# Patient Record
Sex: Male | Born: 1945 | Race: White | Hispanic: No | Marital: Married | Smoking: Current every day smoker
Health system: Southern US, Community
[De-identification: ages and names within clinical notes are randomized; demographics above are authoritative.]

## PROBLEM LIST (undated history)

## (undated) DIAGNOSIS — I82409 Acute embolism and thrombosis of unspecified deep veins of unspecified lower extremity: Secondary | ICD-10-CM

## (undated) DIAGNOSIS — M1990 Unspecified osteoarthritis, unspecified site: Secondary | ICD-10-CM

## (undated) DIAGNOSIS — I10 Essential (primary) hypertension: Secondary | ICD-10-CM

## (undated) DIAGNOSIS — I429 Cardiomyopathy, unspecified: Secondary | ICD-10-CM

## (undated) DIAGNOSIS — E785 Hyperlipidemia, unspecified: Secondary | ICD-10-CM

## (undated) DIAGNOSIS — J449 Chronic obstructive pulmonary disease, unspecified: Secondary | ICD-10-CM

## (undated) DIAGNOSIS — I2510 Atherosclerotic heart disease of native coronary artery without angina pectoris: Secondary | ICD-10-CM

## (undated) DIAGNOSIS — K219 Gastro-esophageal reflux disease without esophagitis: Secondary | ICD-10-CM

## (undated) DIAGNOSIS — I6529 Occlusion and stenosis of unspecified carotid artery: Secondary | ICD-10-CM

## (undated) DIAGNOSIS — I701 Atherosclerosis of renal artery: Secondary | ICD-10-CM

## (undated) DIAGNOSIS — R911 Solitary pulmonary nodule: Secondary | ICD-10-CM

## (undated) DIAGNOSIS — Z720 Tobacco use: Secondary | ICD-10-CM

## (undated) DIAGNOSIS — I639 Cerebral infarction, unspecified: Secondary | ICD-10-CM

## (undated) DIAGNOSIS — I739 Peripheral vascular disease, unspecified: Secondary | ICD-10-CM

## (undated) HISTORY — DX: Cardiomyopathy, unspecified: I42.9

## (undated) HISTORY — DX: Unspecified osteoarthritis, unspecified site: M19.90

## (undated) HISTORY — DX: Atherosclerosis of renal artery: I70.1

## (undated) HISTORY — DX: Hyperlipidemia, unspecified: E78.5

## (undated) HISTORY — DX: Atherosclerotic heart disease of native coronary artery without angina pectoris: I25.10

## (undated) HISTORY — DX: Chronic obstructive pulmonary disease, unspecified: J44.9

## (undated) HISTORY — DX: Tobacco use: Z72.0

## (undated) HISTORY — DX: Cerebral infarction, unspecified: I63.9

## (undated) HISTORY — DX: Essential (primary) hypertension: I10

## (undated) HISTORY — DX: Acute embolism and thrombosis of unspecified deep veins of unspecified lower extremity: I82.409

## (undated) HISTORY — DX: Occlusion and stenosis of unspecified carotid artery: I65.29

## (undated) HISTORY — DX: Peripheral vascular disease, unspecified: I73.9

## (undated) HISTORY — DX: Solitary pulmonary nodule: R91.1

---

## 2012-05-29 DIAGNOSIS — I639 Cerebral infarction, unspecified: Secondary | ICD-10-CM

## 2012-05-29 HISTORY — DX: Cerebral infarction, unspecified: I63.9

## 2012-06-05 ENCOUNTER — Ambulatory Visit (INDEPENDENT_AMBULATORY_CARE_PROVIDER_SITE_OTHER): Payer: Managed Care, Other (non HMO) | Admitting: Vascular Surgery

## 2012-06-05 ENCOUNTER — Encounter: Payer: Self-pay | Admitting: Surgery

## 2012-06-05 ENCOUNTER — Other Ambulatory Visit: Payer: Self-pay

## 2012-06-05 ENCOUNTER — Ambulatory Visit (INDEPENDENT_AMBULATORY_CARE_PROVIDER_SITE_OTHER): Payer: Managed Care, Other (non HMO) | Admitting: Surgery

## 2012-06-05 VITALS — BP 165/85 | HR 80 | Resp 16 | Ht 66.0 in | Wt 156.0 lb

## 2012-06-05 DIAGNOSIS — I6529 Occlusion and stenosis of unspecified carotid artery: Secondary | ICD-10-CM

## 2012-06-05 DIAGNOSIS — I63239 Cerebral infarction due to unspecified occlusion or stenosis of unspecified carotid arteries: Secondary | ICD-10-CM

## 2012-06-05 DIAGNOSIS — Z01810 Encounter for preprocedural cardiovascular examination: Secondary | ICD-10-CM

## 2012-06-05 NOTE — Addendum Note (Signed)
Addended by: Sharee Pimple on: 06/05/2012 12:50 PM   Modules accepted: Orders

## 2012-06-05 NOTE — Progress Notes (Signed)
[facility] [place]   Patient name: Belal Murad MRN: [id number] DOB: 07/06/1946 Sex: male   Referred by: Dr. Gage  Reason for referral:  Chief Complaint  Patient presents with  . Carotid    New pt    HISTORY OF PRESENT ILLNESS: The patient is an adult for today's clinic for evaluation of carotid stenosis in the setting of its recent stroke. This is a 66-year-old gentleman who was no medical problems prior to November 18. At that time he developed right hand numbness as well as trouble straightening his fingers. He was admitted to the hospital with an acute stroke. His initial CT scan of the head was negative however a MRI revealed acute left middle cerebral artery infarcts involving the left frontal and parietal lobe. There was a tiny left parietal acute infarct. He also underwent a carotid duplex ultrasound which showed greater than 70% left carotid stenosis and 70% right carotid stenosis. The patient is back to his normal neurologic status. He is now taking 3 antihypertensive medications. He is also on a statin for elevated cholesterol. He is taking Plavix as well.  Past Medical History  Diagnosis Date  . Carotid artery occlusion   . Right arm weakness   . Stroke 05/29/2012    No past surgical history on file.  History   Social History  . Marital Status: Married    Spouse Name: N/A    Number of Children: N/A  . Years of Education: N/A   Occupational History  . Not on file.   Social History Main Topics  . Smoking status: Current Every Day Smoker -- 1.0 packs/day for 45 years    Types: Cigarettes  . Smokeless tobacco: Never Used  . Alcohol Use: 27.0 oz/week    45 Cans of beer per week  . Drug Use: No  . Sexually Active: Not on file   Other Topics Concern  . Not on file   Social History Narrative  . No narrative on file    No family history on file.  Allergies as of 06/05/2012 - Review Complete 06/05/2012  Allergen Reaction Noted  .  Penicillins  06/05/2012    Current Outpatient Prescriptions on File Prior to Visit  Medication Sig Dispense Refill  . atorvastatin (LIPITOR) 10 MG tablet Take 10 mg by mouth daily.      . metoprolol succinate (TOPROL-XL) 25 MG 24 hr tablet Take 25 mg by mouth daily.      . valsartan-hydrochlorothiazide (DIOVAN-HCT) 320-12.5 MG per tablet Take 1 tablet by mouth daily.         REVIEW OF SYSTEMS: Please see history of present illness. All of systems are negative  PHYSICAL EXAMINATION: General: The patient appears their stated age.  Vital signs are BP 165/85  Pulse 80  Resp 16  Ht 5' 6" (1.676 m)  Wt 156 lb (70.761 kg)  BMI 25.18 kg/m2  SpO2 100% HEENT:  No gross abnormalities Pulmonary: Respirations are non-labored Abdomen: Soft and non-tender . Aorta feels non-aneurysmal Musculoskeletal: There are no major deformities.   Neurologic: No focal weakness or paresthesias are detected, Skin: There are no ulcer or rashes noted. Psychiatric: The patient has normal affect. Cardiovascular: There is a regular rate and rhythm without significant murmur appreciated. No carotid bruits  Diagnostic Studies: Ultrasound was repeated today this shows 40-59% bilateral carotid stenosis  Outside Studies/Documentation Historical records were reviewed.  They showed left brain stroke, carotid stenosis  Medication Changes: I placed the patient back on   an 81 mg aspirin in addition to his Plavix  Assessment:  Left brain stroke Plan: Ultrasound in my office today revealed stenosis of the left carotid artery that is not as significant as the stenosis obtained at the hospital. For definitive recommendations can be made I need to better define the degree of stenosis within the left carotid artery. For that reason I scheduled the patient for a CT angiogram to be performed in the immediate future. Should the patient be found to have a moderate to high-grade left carotid stenosis, we would proceed with carotid  endarterectomy. If the stenosis is found to be less than 50%, I would pursue medical therapy only. I did discuss the details of the operation including the risks and benefits. We discussed the risk of stroke as well as the  Risk of nerve injury. We also discussed the risk of bleeding. I do not want him to stop his aspirin or Plavix prior to any operation. I have tentatively scheduled him for a left carotid endarterectomy on Friday, December 6. I will cancel his operation if the CT scan does not show significant left carotid stenosis.      V. Wells Novalynn Branaman IV, M.D. [facility] [place] Office: [phone] Pager:  [phone]   

## 2012-06-05 NOTE — Progress Notes (Signed)
Carotid duplex performed @ [facility] 06/05/2012 

## 2012-06-06 ENCOUNTER — Encounter (HOSPITAL_COMMUNITY)
Admission: RE | Admit: 2012-06-06 | Discharge: 2012-06-06 | Disposition: A | Discharge status: Home / Self Care | Payer: Managed Care, Other (non HMO) | Source: Ambulatory Visit | Attending: Surgery | Admitting: Surgery

## 2012-06-06 ENCOUNTER — Other Ambulatory Visit: Payer: Self-pay

## 2012-06-06 ENCOUNTER — Encounter (HOSPITAL_COMMUNITY): Payer: Self-pay

## 2012-06-06 LAB — COMPREHENSIVE METABOLIC PANEL
ALT: 23 U/L (ref 0–53)
AST: 18 U/L (ref 0–37)
Alkaline Phosphatase: 60 U/L (ref 39–117)
CO2: 25 mEq/L (ref 19–32)
GFR calc Af Amer: >90 mL/min (ref >90)
GFR calc non Af Amer: >90 mL/min (ref >90)
Glucose, Bld: 100 mg/dL — ABNORMAL HIGH (ref 70–99)
Potassium: 4.6 mEq/L (ref 3.5–5.1)
Sodium: 124 mEq/L — ABNORMAL LOW (ref 135–145)

## 2012-06-06 LAB — URINALYSIS, ROUTINE W REFLEX MICROSCOPIC
Bilirubin Urine: NEGATIVE
Glucose, UA: NEGATIVE mg/dL
Ketones, ur: 15 mg/dL — AB
Leukocytes, UA: NEGATIVE
Specific Gravity, Urine: 1.018 (ref 1.005–1.030)
pH: 6.5 (ref 5.0–8.0)

## 2012-06-06 LAB — CBC
Hemoglobin: 14.2 g/dL (ref 13.0–17.0)
RBC: 4.58 MIL/uL (ref 4.22–5.81)
WBC: 15.6 10*3/uL — ABNORMAL HIGH (ref 4.0–10.5)

## 2012-06-06 LAB — ABO/RH: ABO/RH(D): O POS

## 2012-06-06 LAB — PROTIME-INR: Prothrombin Time: 11.6 seconds (ref 11.6–15.2)

## 2012-06-06 LAB — APTT: aPTT: 32 seconds (ref 24–37)

## 2012-06-06 LAB — SURGICAL PCR SCREEN: Staphylococcus aureus: NEGATIVE

## 2012-06-06 NOTE — Pre-Procedure Instructions (Addendum)
[address] Rom[address] Letizia  06/06/2012   Your procedure is scheduled on:  Friday, December 6th  Report to [facility] at 0830 AM.   Call this number if you have problems the morning of surgery: [phone]   Remember:   Do not eat food or drink any liquids:After Midnight Thursday   Take these medicines the morning of surgery with A SIP OF WATER: toprol   Do not wear jewelry.  Do not wear lotions, powders, deoderant or colognes.                                                                                                    Men may shave face and neck.   Do not bring valuables to the hospital.  Contacts, dentures or bridgework may not be worn into surgery.   Leave suitcase in the car. After surgery it may be brought to your room.  For patients admitted to the hospital, checkout time is 11:00 AM the day of discharge.   Patients discharged the day of surgery will not be allowed to drive home.   Special Instructions: Shower using CHG 2 nights before surgery and the night before surgery.  If you shower the day of surgery use CHG.  Use special wash - you have one bottle of CHG for all showers.  You should use approximately 1/3 of the bottle for each shower.   Please read over the following fact sheets that you were given: Pain Booklet, Coughing and Deep Breathing, Blood Transfusion Information, MRSA Information and Surgical Site Infection Prevention

## 2012-06-06 NOTE — Progress Notes (Addendum)
Tuesday @ [facility].  AFTER SPEAKING WITH WIFE, PATIENT HAD BLOOD PRESSURE ISSUES A YR AGO, BUT DID NOT DO ANYTHING ABOUT IT THEN.  LAST WEEK,  WIFE HAD TAKEN HIM TO DR. GAGE IN [place], BECAUSE HE HAD 'A BUG'....AND MD NOTED BP WAS STILL UP....SO, SEEING DR. Samuel Germany WAS HIS FIRST 'REAL' VISIT IN A LONG TIME....DA       I CALLED [facility] TO SEE IF [facility] HAD EKG FOR COMPARISON....THEY DO NOT HAVE ONE!  DA

## 2012-06-06 NOTE — Progress Notes (Signed)
Discussed abnormal sodium level with Dr. Noreene Larsson.  Request lab be repeated day of surgery and have Revonda Standard review chart on 06/07/12.

## 2012-06-07 ENCOUNTER — Encounter (HOSPITAL_COMMUNITY): Payer: Self-pay | Admitting: Vascular Surgery

## 2012-06-07 NOTE — Consult Note (Addendum)
Anesthesia chart review: Patient is a 66 year old male scheduled for left carotid endarterectomy on 06/16/2012 by Dr. Myra Gianotti. History includes left parietal CVA 05/29/2012, HTN, smoking. Previous BP readings on 06/05/12 were 208/88 with recheck of 165/85 and on 06/06/12 192/90.  Meds include Plavix, Lipitor, Toprol XL 25 mg daily, Diovan-HCT 320/12.5 daily. PCP is Dr. Renae Fickle.   EKG on 06/06/12 showed NSR, possible LAE, LVH with proabable repolarization abnormality (versus lateral ischemia), cannot rule out septal infarct (age undetermined). His T wave abnormality is less apparent on his EKG from [facility] from 05/29/12.  There are no other comparison EKGs at his PCP office.      Echo on 06/02/12 [facility]) showed mild concentric LVH, normal global LV contractility, low-normal LV systolic function, EF 50-55%.  There is evidence of paradoxical septal motion.  Diastolic filling pattern indicates impaired relaxation.  No regional wall motion abnormalities.  Normal RV size and function.  Normal LA/RA size.  No AS or AR.  No MR.  Mild TR.  No evidence of pulmonary HTN.  RVSP 35.49 mmHg.  No pulmonic regurgitation.  Normal appearing aortic root, ascending aorta, and aortic arch. No pericardial effusion.  CXR on 06/06/12 showed: Possible nodule or mass seen projected over lower thoracic spine on the lateral projection only. CT scan of chest is recommended for further evaluation.  (I've called this result to Lamar Blinks, RN at [facility]).  Labs noted.  Na 124, Cl 90, glucose 100, WBC 15.6. (I called Na and WBC results to Okey Regal at [facility].) He will need a repeat BMET on arrival.  I reviewed above with Anesthesiologist Dr. Noreene Larsson.  If no acute CV symptoms and Na is stable/improved then he anticipates patient can proceed as planned.  Defer timing of possible T-spine nodule to Dr. Myra Gianotti.  Dr. Martha Clan office is now closed.  I will ask nursing staff to contact Dr. Martha Clan office on the next business day  to ensure he (or one of his partners) is aware of the lab results so further recommendations can be made as felt appropriate.   Shonna Chock, PA-C 06/07/12 1730  Addendum: 06/12/12 1115 I spoke with Luster Landsberg earlier today at Dr. Martha Clan office regarding patient's hyponatremia.  Labs faxed to his office for further review.  Dr. Myra Gianotti has arranged for patient to have a nuclear stress test on 06/14/12 at [facility] and plans to order a chest CT while patient is in the hospital for his carotid surgery.  Addendum: 06/15/12 0940 Patient had a stress and echo on 06/14/12.  Nuclear stress test results showed: There is no sign of scar or ischemia. There is decreased LV systolic function with global hypokinesis. LV Ejection Fraction: 35%. LV Wall Motion: Global hypokinesis.  Patient subsequently had an echo that showed:  Left ventricle: The cavity size was normal. Wall thickness was increased in a pattern of mild LVH. Systolic function was mildly to moderately reduced. The estimated ejection fraction was in the range of 40% to 45%. There is hypokinesis of the apical myocardium. Doppler parameters are consistent with abnormal left ventricular relaxation (grade 1 diastolic dysfunction).  Patient now has a Cardiology appointment with Dr. Shirlee Latch at 1630 today.  Will follow-up results if available by the end of the day.  (Currently, patient is not scheduled to be a first case.)

## 2012-06-12 ENCOUNTER — Other Ambulatory Visit: Payer: Self-pay | Admitting: *Deleted

## 2012-06-12 DIAGNOSIS — Z01810 Encounter for preprocedural cardiovascular examination: Secondary | ICD-10-CM

## 2012-06-14 ENCOUNTER — Other Ambulatory Visit: Payer: Self-pay

## 2012-06-14 ENCOUNTER — Ambulatory Visit (HOSPITAL_COMMUNITY): Payer: Managed Care, Other (non HMO) | Attending: Surgery | Admitting: Radiology

## 2012-06-14 ENCOUNTER — Telehealth: Payer: Self-pay

## 2012-06-14 ENCOUNTER — Ambulatory Visit (HOSPITAL_BASED_OUTPATIENT_CLINIC_OR_DEPARTMENT_OTHER): Payer: Managed Care, Other (non HMO) | Admitting: Radiology

## 2012-06-14 VITALS — BP 182/82 | Ht 66.0 in | Wt 155.0 lb

## 2012-06-14 DIAGNOSIS — R9439 Abnormal result of other cardiovascular function study: Secondary | ICD-10-CM

## 2012-06-14 DIAGNOSIS — Z01810 Encounter for preprocedural cardiovascular examination: Secondary | ICD-10-CM

## 2012-06-14 DIAGNOSIS — R5381 Other malaise: Secondary | ICD-10-CM | POA: Insufficient documentation

## 2012-06-14 DIAGNOSIS — Z8673 Personal history of transient ischemic attack (TIA), and cerebral infarction without residual deficits: Secondary | ICD-10-CM | POA: Insufficient documentation

## 2012-06-14 DIAGNOSIS — R0602 Shortness of breath: Secondary | ICD-10-CM

## 2012-06-14 DIAGNOSIS — R5383 Other fatigue: Secondary | ICD-10-CM | POA: Insufficient documentation

## 2012-06-14 DIAGNOSIS — R9430 Abnormal result of cardiovascular function study, unspecified: Secondary | ICD-10-CM

## 2012-06-14 DIAGNOSIS — I6529 Occlusion and stenosis of unspecified carotid artery: Secondary | ICD-10-CM

## 2012-06-14 DIAGNOSIS — R0609 Other forms of dyspnea: Secondary | ICD-10-CM | POA: Insufficient documentation

## 2012-06-14 DIAGNOSIS — R0989 Other specified symptoms and signs involving the circulatory and respiratory systems: Secondary | ICD-10-CM | POA: Insufficient documentation

## 2012-06-14 DIAGNOSIS — I10 Essential (primary) hypertension: Secondary | ICD-10-CM | POA: Insufficient documentation

## 2012-06-14 DIAGNOSIS — I779 Disorder of arteries and arterioles, unspecified: Secondary | ICD-10-CM | POA: Insufficient documentation

## 2012-06-14 DIAGNOSIS — F17200 Nicotine dependence, unspecified, uncomplicated: Secondary | ICD-10-CM | POA: Insufficient documentation

## 2012-06-14 MED ORDER — REGADENOSON 0.4 MG/5ML IV SOLN
0.4000 mg | Freq: Once | INTRAVENOUS | Status: AC
  Administered 2012-06-14: 0.4 mg via INTRAVENOUS

## 2012-06-14 MED ORDER — TECHNETIUM TC 99M SESTAMIBI GENERIC - CARDIOLITE
33.0000 | Freq: Once | INTRAVENOUS | Status: AC | PRN
  Administered 2012-06-14: 33 via INTRAVENOUS

## 2012-06-14 MED ORDER — TECHNETIUM TC 99M SESTAMIBI GENERIC - CARDIOLITE
10.9000 | Freq: Once | INTRAVENOUS | Status: AC | PRN
  Administered 2012-06-14: 10.9 via INTRAVENOUS

## 2012-06-14 NOTE — Patient Instructions (Signed)
Per Dr Johney Frame  Echo/Dr Shirlee Latch ----pt having surgery on Friday with Dr Myra Gianotti

## 2012-06-14 NOTE — Progress Notes (Signed)
Echocardiogram performed.  

## 2012-06-14 NOTE — Telephone Encounter (Signed)
Release faxed to [facility] @ [phone] to Obtain Echo. **Echo was Received back from [facility] gave to [facility] 06/14/12/KM

## 2012-06-14 NOTE — Progress Notes (Signed)
[facility] SITE 3 NUCLEAR MED [address] [id number] [place] [address] [phone]  Cardiology Nuclear Med Study  Darrell Willis is a 66 y.o. male     MRN : [id number]     DOB: [birth year]  Procedure Date: 06/14/2012  Nuclear Med Background Indication for Stress Test:  Evaluation for Ischemia, and  Surgical Clearance for  (L) CEA on 06-16-12  by Dr. Juleen China History:  No prior Cardiac History Cardiac Risk Factors: Carotid Disease, CVA, Hypertension and Smoker  Symptoms:  DOE and Fatigue   Nuclear Pre-Procedure Caffeine/Decaff Intake:  Patient admitted to drinking a few sips of coffee @ 6:30 AM after Lexiscan injection. > 12 hrs NPO After: 8:00pm   Lungs:  clear O2 Sat: 97% on room air. IV 0.9% NS with Angio Cath:  20g  IV Site: R Antecubital x 1, tolerated well IV Started by:  Irean Hong, RN  Chest Size (in):  40 Cup Size: n/a  Height: 5\' 6"  (1.676 m)  Weight:  155 lb (70.308 kg)  BMI:  Body mass index is 25.02 kg/(m^2). Tech Comments: Patient with low EF-Dr. Allred aware. Patient scheduled for Echo this afternoon and OV with Dr. Shirlee Latch tomorrow.    Nuclear Med Study 1 or 2 day study: 1 day  Stress Test Type:  Eugenie Birks  Reading MD: Cassell Clement, MD  Order Authorizing Provider:  Arelia Longest, MD  Resting Radionuclide: Technetium 6m Sestamibi  Resting Radionuclide Dose: 10.9 mCi   Stress Radionuclide:  Technetium 39m Sestamibi  Stress Radionuclide Dose: 33.0 mCi           Stress Protocol Rest HR: 66 Stress HR: 82  Rest BP: 182/82 Stress BP: 193/95  Exercise Time (min): n/a METS: n/a   Predicted Max HR: 156 bpm % Max HR: 52.56 bpm Rate Pressure Product: 62952   Dose of Adenosine (mg):  n/a Dose of Lexiscan: 0.4 mg  Dose of Atropine (mg): n/a Dose of Dobutamine: n/a mcg/kg/min (at max HR)  Stress Test Technologist: Bonnita Levan, RN  Nuclear Technologist:  Domenic Polite, CNMT     Rest Procedure:  Myocardial perfusion imaging  was performed at rest 45 minutes following the intravenous administration of Technetium 16m Tetrofosmin. Rest ECG: NSR with non-specific ST-T wave changes  Stress Procedure:  The patient received IV Lexiscan 0.4 mg over 15-seconds.  Technetium 41m Tetrofosmin injected at 30-seconds.   Quantitative spect images were obtained after a 45 minute delay. Stress ECG: No significant change from baseline ECG  QPS Raw Data Images:  Normal; no motion artifact; normal heart/lung ratio. Stress Images:  Normal homogeneous uptake in all areas of the myocardium. Rest Images:  Normal homogeneous uptake in all areas of the myocardium. Subtraction (SDS):  No evidence of ischemia. Transient Ischemic Dilatation (Normal <1.22):  1.14 Lung/Heart Ratio (Normal <0.45):  0.34  Quantitative Gated Spect Images QGS EDV:  161 ml QGS ESV:  104 ml  Impression Exercise Capacity:  Lexiscan with no exercise. BP Response:  Normal blood pressure response. Clinical Symptoms:  There is dyspnea. ECG Impression:  No significant ST segment change suggestive of ischemia. Comparison with Prior Nuclear Study: No previous nuclear study performed  Overall Impression:  There is no sign of scar or ischemia. There is decreased LV systolic function with global hypokinesis.  LV Ejection Fraction: 35%.  LV Wall Motion:  Global hypokinesis  Cassell Clement

## 2012-06-15 ENCOUNTER — Encounter: Payer: Self-pay | Admitting: Cardiology

## 2012-06-15 ENCOUNTER — Ambulatory Visit (INDEPENDENT_AMBULATORY_CARE_PROVIDER_SITE_OTHER): Payer: Managed Care, Other (non HMO) | Admitting: Cardiology

## 2012-06-15 ENCOUNTER — Encounter: Payer: Self-pay | Admitting: *Deleted

## 2012-06-15 ENCOUNTER — Encounter (HOSPITAL_COMMUNITY): Payer: Self-pay | Admitting: Surgery

## 2012-06-15 ENCOUNTER — Other Ambulatory Visit: Payer: Self-pay | Admitting: Cardiology

## 2012-06-15 ENCOUNTER — Ambulatory Visit: Payer: Managed Care, Other (non HMO) | Admitting: Cardiology

## 2012-06-15 VITALS — BP 160/74 | HR 98 | Ht 66.0 in | Wt 156.0 lb

## 2012-06-15 DIAGNOSIS — I429 Cardiomyopathy, unspecified: Secondary | ICD-10-CM | POA: Insufficient documentation

## 2012-06-15 DIAGNOSIS — R9430 Abnormal result of cardiovascular function study, unspecified: Secondary | ICD-10-CM

## 2012-06-15 DIAGNOSIS — I639 Cerebral infarction, unspecified: Secondary | ICD-10-CM | POA: Insufficient documentation

## 2012-06-15 DIAGNOSIS — F17200 Nicotine dependence, unspecified, uncomplicated: Secondary | ICD-10-CM | POA: Insufficient documentation

## 2012-06-15 DIAGNOSIS — I6529 Occlusion and stenosis of unspecified carotid artery: Secondary | ICD-10-CM

## 2012-06-15 DIAGNOSIS — I10 Essential (primary) hypertension: Secondary | ICD-10-CM | POA: Insufficient documentation

## 2012-06-15 DIAGNOSIS — I6350 Cerebral infarction due to unspecified occlusion or stenosis of unspecified cerebral artery: Secondary | ICD-10-CM

## 2012-06-15 DIAGNOSIS — I428 Other cardiomyopathies: Secondary | ICD-10-CM

## 2012-06-15 DIAGNOSIS — I5022 Chronic systolic (congestive) heart failure: Secondary | ICD-10-CM

## 2012-06-15 MED ORDER — VANCOMYCIN HCL IN DEXTROSE 1-5 GM/200ML-% IV SOLN: 1000.0000 mg | INTRAVENOUS | Status: DC

## 2012-06-15 MED ORDER — AMLODIPINE BESYLATE 5 MG PO TABS: 5.0000 mg | ORAL_TABLET | Freq: Every day | ORAL | Status: DC

## 2012-06-15 MED ORDER — ATORVASTATIN CALCIUM 40 MG PO TABS: 40.0000 mg | ORAL_TABLET | Freq: Every day | ORAL | Status: DC

## 2012-06-15 MED ORDER — SODIUM CHLORIDE 0.9 % IV SOLN: INTRAVENOUS | Status: DC

## 2012-06-15 NOTE — Progress Notes (Signed)
Patient ID: Darrell Willis, male   DOB: 11/21/1945, 66 y.o.   MRN: [id number] PCP: Dr. Samuel Germany  66 yo with history of recent CVA presents for cardiology evaluation prior to left CEA.  Patient was admitted to [facility] in 11/13 with RUE weakness.  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  CEA was planned for tomorrow.  He was sent for preoperative Lexiscan myoview showing EF 35% but no evidence for ischemia or infarction.  Echo was then done, showing EF 40% with apical hypokinesis. He is an active smoker.  BP is uncontrolled (SBP up to 170s at home).    Darrell Willis denies chest pain.  He has mild DOE walking up hills, otherwise he tends to do ok with exertion.  He still works full time as a Designer, industrial/product.  Main exertional complaint is left calf pain.  He notes this with walking after about 100-200 feet.  If he rests briefly the pain resolved.    ECG: NSR, LVH with repolarization abnormality  Labs (11/13): Na 124, K 4.6, creatinine 0.81   PMH: 1. HTN 2. Hyperlipidemia 3. CVA: 11/13 admission to [facility] with right arm weakness and was found to have acute CVA.  L MCA infarct was found.  Carotid dopplers showed > 70% bilateral ICA stenosis.  CTA neck showed severe left ICA stenosis.   4. Active smoker, 1 ppd.  5. Cardiomyopathy: Lexiscan myoview (12/13) with EF 35%, global hypokinesis, no scar or ischemia. Echo (12/13) with EF 40-45%, mild LVH, apical hypokinesis.    SH: Married, lives in [place], smokes 1 ppd, Designer, industrial/product.   FH: No known premature CAD.   ROS: All systems reviewed and negative except as per HPI.   Current Outpatient Prescriptions  Medication Sig Dispense Refill  . amLODipine (NORVASC) 5 MG tablet Take 1 tablet (5 mg total) by mouth daily.  30 tablet  6  . aspirin EC 81 MG tablet Take 81 mg by mouth daily.      [name] atorvastatin (LIPITOR) 40 MG tablet Take 1 tablet (40 mg total) by mouth daily.  30 tablet  3  . clopidogrel  (PLAVIX) 75 MG tablet Take 75 mg by mouth daily.      . metoprolol succinate (TOPROL-XL) 25 MG 24 hr tablet Take 25 mg by mouth daily.      . valsartan-hydrochlorothiazide (DIOVAN-HCT) 320-12.5 MG per tablet Take 1 tablet by mouth daily.      . [DISCONTINUED] atorvastatin (LIPITOR) 10 MG tablet Take 10 mg by mouth daily.       No current facility-administered medications for this visit.   Facility-Administered Medications Ordered in Other Visits  Medication Dose Route Frequency Provider Last Rate Last Dose  . 0.9 %  sodium chloride infusion   Intravenous Continuous Nada Libman, MD      . vancomycin (VANCOCIN) IVPB 1000 mg/200 mL premix  1,000 mg Intravenous 60 min Pre-Op Nada Libman, MD       BP 160/74  Pulse 98  Ht 5\' 6"  (1.676 m)  Wt 156 lb (70.761 kg)  BMI 25.18 kg/m2  SpO2 98% General: NAD Neck: No JVD, no thyromegaly or thyroid nodule.  Lungs: Clear to auscultation with prolonged expiratory phase.  CV: Nondisplaced PMI.  Heart regular S1/S2, no S3/S4, no murmur.  No peripheral edema.  Bilateral carotid bruits.  Absent left pedal pulses.  Abdomen: Soft, nontender, no hepatosplenomegaly, no distention.  Skin: Intact without lesions or rashes.  Neurologic: Alert and oriented x 3.  Psych: Normal affect. Extremities: No clubbing or cyanosis.  HEENT: Normal.   Assessment/Plan: 1. Cardiomyopathy: EF 35% by Myoview, 40% by echo. The Myoview did not show ischemia or infarction by perfusion imaging.  The echo showed apical hypokinesis.  Darrell Willis does not have chest pain episodes.  He gets short of breath walking up a hill.  He has known vascular disease and is a smoker with poorly controlled HTN and hyperlipidemia.  I am concerned that he has an ischemic cardiomyopathy.  I think he is at substantial risk for three vessel disease which could explain the lack of significant defect seen on Myoview (possible balanced ischemia).  The apical wall motion abnormality on the echo is  concerning for an LAD lesion.  Given the intermediate risk myoview with concern for multivessel CAD, I will plan on taking Darrell Willis for [facility] tomorrow.  I talked to Dr. Myra Gianotti today and will delay surgery until after cath.  He should continue ASA 81, Toprol XL, Plavix, ARB.  Would eventually transition off Toprol XL and onto Coreg given low EF.   2. CVA: Recent CVA with severe LICA stenosis, needs left CEA soon.  As above, concern for possible multivessel CAD so will do cath tomorrow pre-surgery.   - Continue ASA, Plavix.  - Would increase statin to higher dose, use atorvastatin 40 mg daily instead of 10 mg daily.  Needs lipids/LFTs in 2 months.  3. Smoking: I strongly encouraged patient to quit smoking.  4. HTN: Uncontrolled.  I will add amlodipine 5 mg daily to his regimen.   5. Claudication: Symptoms concerning for left calf claudication with difficult to palpate pulses in the left foot.  He will need peripheral arterial dopplers evaluation.   Marca Ancona 06/15/2012

## 2012-06-15 NOTE — Progress Notes (Signed)
Patient notified of time change to be here at 0730 tomorrow morning.

## 2012-06-15 NOTE — Patient Instructions (Addendum)
Increase atorvastatin to 40mg  daily. You can take 4 of your 10mg  tablets at the same time and use your current supply.  Start amlodipine 5mg  daily.  Your physician has requested that you have a cardiac catheterization. Cardiac catheterization is used to diagnose and/or treat various heart conditions. Doctors may recommend this procedure for a number of different reasons. The most common reason is to evaluate chest pain. Chest pain can be a symptom of coronary artery disease (CAD), and cardiac catheterization can show whether plaque is narrowing or blocking your heart's arteries. This procedure is also used to evaluate the valves, as well as measure the blood flow and oxygen levels in different parts of your heart. For further information please visit [email or web address] Please follow instruction sheet, as given.  Your physician recommends that you schedule a follow-up appointment in: 2 weeks with the PA/NP.

## 2012-06-16 ENCOUNTER — Encounter (HOSPITAL_COMMUNITY)
Admission: RE | Disposition: A | Discharge status: Home / Self Care | Payer: Self-pay | Source: Ambulatory Visit | Attending: Surgery

## 2012-06-16 ENCOUNTER — Ambulatory Visit (HOSPITAL_COMMUNITY)
Admission: RE | Admit: 2012-06-16 | Discharge: 2012-06-16 | Disposition: A | Discharge status: Home / Self Care | Payer: Managed Care, Other (non HMO) | Source: Ambulatory Visit | Attending: Surgery | Admitting: Surgery

## 2012-06-16 DIAGNOSIS — I2510 Atherosclerotic heart disease of native coronary artery without angina pectoris: Secondary | ICD-10-CM

## 2012-06-16 DIAGNOSIS — I428 Other cardiomyopathies: Secondary | ICD-10-CM | POA: Insufficient documentation

## 2012-06-16 DIAGNOSIS — E785 Hyperlipidemia, unspecified: Secondary | ICD-10-CM | POA: Insufficient documentation

## 2012-06-16 DIAGNOSIS — I63239 Cerebral infarction due to unspecified occlusion or stenosis of unspecified carotid arteries: Secondary | ICD-10-CM | POA: Insufficient documentation

## 2012-06-16 DIAGNOSIS — I6320 Cerebral infarction due to unspecified occlusion or stenosis of unspecified precerebral arteries: Secondary | ICD-10-CM | POA: Insufficient documentation

## 2012-06-16 DIAGNOSIS — I10 Essential (primary) hypertension: Secondary | ICD-10-CM | POA: Insufficient documentation

## 2012-06-16 DIAGNOSIS — I429 Cardiomyopathy, unspecified: Secondary | ICD-10-CM

## 2012-06-16 HISTORY — PX: LEFT HEART CATHETERIZATION WITH CORONARY ANGIOGRAM: SHX5451

## 2012-06-16 SURGERY — LEFT HEART CATHETERIZATION WITH CORONARY ANGIOGRAM
Anesthesia: LOCAL

## 2012-06-16 SURGERY — ENDARTERECTOMY, CAROTID
Anesthesia: General | Site: Neck | Laterality: Left

## 2012-06-16 MED ORDER — LIDOCAINE HCL (PF) 1 % IJ SOLN
INTRAMUSCULAR | Status: AC
  Filled 2012-06-16: qty 30

## 2012-06-16 MED ORDER — ONDANSETRON HCL 4 MG/2ML IJ SOLN: 4.0000 mg | Freq: Four times a day (QID) | INTRAMUSCULAR | Status: DC | PRN

## 2012-06-16 MED ORDER — SODIUM CHLORIDE 0.9 % IV SOLN
INTRAVENOUS | Status: DC
  Administered 2012-06-16: 09:00:00 via INTRAVENOUS

## 2012-06-16 MED ORDER — SODIUM CHLORIDE 0.9 % IV SOLN: INTRAVENOUS | Status: DC

## 2012-06-16 MED ORDER — ASPIRIN 81 MG PO CHEW
324.0000 mg | CHEWABLE_TABLET | Freq: Once | ORAL | Status: AC
  Administered 2012-06-16: 324 mg via ORAL
  Filled 2012-06-16: qty 4

## 2012-06-16 MED ORDER — FENTANYL CITRATE 0.05 MG/ML IJ SOLN
INTRAMUSCULAR | Status: AC
  Filled 2012-06-16: qty 2

## 2012-06-16 MED ORDER — NITROGLYCERIN 0.2 MG/ML ON CALL CATH LAB
INTRAVENOUS | Status: AC
  Filled 2012-06-16: qty 1

## 2012-06-16 MED ORDER — HEPARIN SODIUM (PORCINE) 1000 UNIT/ML IJ SOLN
INTRAMUSCULAR | Status: AC
  Filled 2012-06-16: qty 1

## 2012-06-16 MED ORDER — SODIUM CHLORIDE 0.9 % IJ SOLN: 3.0000 mL | INTRAMUSCULAR | Status: DC | PRN

## 2012-06-16 MED ORDER — SODIUM CHLORIDE 0.9 % IJ SOLN: 3.0000 mL | Freq: Two times a day (BID) | INTRAMUSCULAR | Status: DC

## 2012-06-16 MED ORDER — SODIUM CHLORIDE 0.9 % IV SOLN: 250.0000 mL | INTRAVENOUS | Status: DC | PRN

## 2012-06-16 MED ORDER — ACETAMINOPHEN 325 MG PO TABS: 650.0000 mg | ORAL_TABLET | ORAL | Status: DC | PRN

## 2012-06-16 MED ORDER — ASPIRIN 81 MG PO CHEW
CHEWABLE_TABLET | ORAL | Status: AC
  Administered 2012-06-16: 324 mg via ORAL
  Filled 2012-06-16: qty 4

## 2012-06-16 MED ORDER — HEPARIN (PORCINE) IN NACL 2-0.9 UNIT/ML-% IJ SOLN
INTRAMUSCULAR | Status: AC
  Filled 2012-06-16: qty 1000

## 2012-06-16 MED ORDER — VERAPAMIL HCL 2.5 MG/ML IV SOLN
INTRAVENOUS | Status: AC
  Filled 2012-06-16: qty 2

## 2012-06-16 MED ORDER — MIDAZOLAM HCL 2 MG/2ML IJ SOLN
INTRAMUSCULAR | Status: AC
  Filled 2012-06-16: qty 2

## 2012-06-16 NOTE — CV Procedure (Signed)
   Cardiac Catheterization Procedure Note  Name: Darrell Willis MRN: [id number] DOB: 09-Jan-1946  Procedure: Left Heart Cath, Selective Coronary Angiography, LV angiography  Indication: Abnormal Myoview and echo, cardiomyopathy.   Procedural Details: The right wrist was prepped, draped, and anesthetized with 1% lidocaine. Using the modified Seldinger technique, a 5 French sheath was introduced into the right radial artery. 3 mg of verapamil was administered through the sheath, weight-based unfractionated heparin was administered intravenously. Standard Judkins catheters were used for selective coronary angiography and left ventriculography. Catheter exchanges were performed over an exchange length guidewire. There were no immediate procedural complications. A TR band was used for radial hemostasis at the completion of the procedure.  The patient was transferred to the post catheterization recovery area for further monitoring.  Procedural Findings: Hemodynamics: AO 146/62 LV 150/14  Coronary angiography: Coronary dominance: right  Left mainstem: 20% distal left main stenosis.   Left anterior descending (LAD): 30% proximal LAD stenosis.  The proximal to mid LAD was heavily calcified and remodeled.  There was 70% stenosis in the mid LAD after the take-off of a moderate D1.    Left circumflex (LCx): 40% ostial LCx stenosis.  Small OM1 with mild disease.  Moderate OM2 with 50-60% proximal stenosis.  Large OM3 with 40% proximal stenosis.   Right coronary artery (RCA): 90% proximal RCA stenosis.    Left ventriculography: LV EF 50% with basal to mid inferior hypokinesis.  No significant MR.    Final Conclusions:  Heavily calcified LAD with moderate mid-vessel stenosis.  90% proximal RCA stenosis.  I again questioned the patient and he has no chest pain and dyspnea only with heavy exertion.  EF does not appear as low on LV-gram as on echo and myoview.  Given lack of symptoms and lack of critical  LAD stenosis, I think that the best course here will be medical management prior to surgery.  We could intervene on the proximal RCA if symptoms develop.  I reviewed the films with Dr. [place].   Recommendations: Aggressive medical management.  I will stop Toprol XL and add Coreg 6.25 mg bid to be titrated up to 12.5 mg bid after 3 days if he tolerates the initial dosing with no problems.  Needs better blood pressure control.  Continue statin, have increased atorvastatin to 40 mg daily.  I spoke with Dr. Myra Gianotti, and plan will be to proceed with CEA next week.   Marca Ancona 06/16/2012, 11:07 AM

## 2012-06-16 NOTE — H&P (View-Only) (Signed)
Patient ID: Darrell Willis, male   DOB: 09/16/1945, 66 y.o.   MRN: [id number] PCP: Dr. Gage  66 yo with history of recent CVA presents for cardiology evaluation prior to left CEA.  Patient was admitted to [facility] in 11/13 with RUE weakness.  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  CEA was planned for tomorrow.  He was sent for preoperative Lexiscan myoview showing EF 35% but no evidence for ischemia or infarction.  Echo was then done, showing EF 40% with apical hypokinesis. He is an active smoker.  BP is uncontrolled (SBP up to 170s at home).    Darrell Willis denies chest pain.  He has mild DOE walking up hills, otherwise he tends to do ok with exertion.  He still works full time as a plastic mold technician.  Main exertional complaint is left calf pain.  He notes this with walking after about 100-200 feet.  If he rests briefly the pain resolved.    ECG: NSR, LVH with repolarization abnormality  Labs (11/13): Na 124, K 4.6, creatinine 0.81   PMH: 1. HTN 2. Hyperlipidemia 3. CVA: 11/13 admission to [facility] with right arm weakness and was found to have acute CVA.  L MCA infarct was found.  Carotid dopplers showed > 70% bilateral ICA stenosis.  CTA neck showed severe left ICA stenosis.   4. Active smoker, 1 ppd.  5. Cardiomyopathy: Lexiscan myoview (12/13) with EF 35%, global hypokinesis, no scar or ischemia. Echo (12/13) with EF 40-45%, mild LVH, apical hypokinesis.    SH: Married, lives in [place], smokes 1 ppd, plastic mold technician.   FH: No known premature CAD.   ROS: All systems reviewed and negative except as per HPI.   Current Outpatient Prescriptions  Medication Sig Dispense Refill  . amLODipine (NORVASC) 5 MG tablet Take 1 tablet (5 mg total) by mouth daily.  30 tablet  6  . aspirin EC 81 MG tablet Take 81 mg by mouth daily.      . atorvastatin (LIPITOR) 40 MG tablet Take 1 tablet (40 mg total) by mouth daily.  30 tablet  3  . clopidogrel  (PLAVIX) 75 MG tablet Take 75 mg by mouth daily.      . metoprolol succinate (TOPROL-XL) 25 MG 24 hr tablet Take 25 mg by mouth daily.      . valsartan-hydrochlorothiazide (DIOVAN-HCT) 320-12.5 MG per tablet Take 1 tablet by mouth daily.      . [DISCONTINUED] atorvastatin (LIPITOR) 10 MG tablet Take 10 mg by mouth daily.       No current facility-administered medications for this visit.   Facility-Administered Medications Ordered in Other Visits  Medication Dose Route Frequency Provider Last Rate Last Dose  . 0.9 %  sodium chloride infusion   Intravenous Continuous Vance W Brabham, MD      . vancomycin (VANCOCIN) IVPB 1000 mg/200 mL premix  1,000 mg Intravenous 60 min Pre-Op Vance W Brabham, MD       BP 160/74  Pulse 98  Ht 5' 6" (1.676 m)  Wt 156 lb (70.761 kg)  BMI 25.18 kg/m2  SpO2 98% General: NAD Neck: No JVD, no thyromegaly or thyroid nodule.  Lungs: Clear to auscultation with prolonged expiratory phase.  CV: Nondisplaced PMI.  Heart regular S1/S2, no S3/S4, no murmur.  No peripheral edema.  Bilateral carotid bruits.  Absent left pedal pulses.  Abdomen: Soft, nontender, no hepatosplenomegaly, no distention.  Skin: Intact without lesions or rashes.    Neurologic: Alert and oriented x 3.  Psych: Normal affect. Extremities: No clubbing or cyanosis.  HEENT: Normal.   Assessment/Plan: 1. Cardiomyopathy: EF 35% by Myoview, 40% by echo. The Myoview did not show ischemia or infarction by perfusion imaging.  The echo showed apical hypokinesis.  Darrell Willis does not have chest pain episodes.  He gets short of breath walking up a hill.  He has known vascular disease and is a smoker with poorly controlled HTN and hyperlipidemia.  I am concerned that he has an ischemic cardiomyopathy.  I think he is at substantial risk for three vessel disease which could explain the lack of significant defect seen on Myoview (possible balanced ischemia).  The apical wall motion abnormality on the echo is  concerning for an LAD lesion.  Given the intermediate risk myoview with concern for multivessel CAD, I will plan on taking Darrell Willis for LHC tomorrow.  I talked to Dr. Brabham today and will delay surgery until after cath.  He should continue ASA 81, Toprol XL, Plavix, ARB.  Would eventually transition off Toprol XL and onto Coreg given low EF.   2. CVA: Recent CVA with severe LICA stenosis, needs left CEA soon.  As above, concern for possible multivessel CAD so will do cath tomorrow pre-surgery.   - Continue ASA, Plavix.  - Would increase statin to higher dose, use atorvastatin 40 mg daily instead of 10 mg daily.  Needs lipids/LFTs in 2 months.  3. Smoking: I strongly encouraged patient to quit smoking.  4. HTN: Uncontrolled.  I will add amlodipine 5 mg daily to his regimen.   5. Claudication: Symptoms concerning for left calf claudication with difficult to palpate pulses in the left foot.  He will need peripheral arterial dopplers evaluation.   Darrell Willis 06/15/2012   

## 2012-06-16 NOTE — Interval H&P Note (Signed)
History and Physical Interval Note:  06/16/2012 10:20 AM  Darrell Willis  has presented today for surgery, with the diagnosis of cardiomyopathy  The various methods of treatment have been discussed with the patient and family. After consideration of risks, benefits and other options for treatment, the patient has consented to  Procedure(s) (LRB) with comments: LEFT HEART CATHETERIZATION WITH CORONARY ANGIOGRAM (N/A) - radial as a surgical intervention .  The patient's history has been reviewed, patient examined, no change in status, stable for surgery.  I have reviewed the patient's chart and labs.  Questions were answered to the patient's satisfaction.     Dalton [employer]

## 2012-06-20 ENCOUNTER — Other Ambulatory Visit: Payer: Self-pay

## 2012-06-21 ENCOUNTER — Encounter (HOSPITAL_COMMUNITY): Payer: Self-pay

## 2012-06-21 ENCOUNTER — Encounter (HOSPITAL_COMMUNITY)
Admission: RE | Admit: 2012-06-21 | Discharge: 2012-06-21 | Disposition: A | Discharge status: Home / Self Care | Payer: Managed Care, Other (non HMO) | Source: Ambulatory Visit | Attending: Surgery | Admitting: Surgery

## 2012-06-21 LAB — COMPREHENSIVE METABOLIC PANEL
ALT: 28 U/L (ref 0–53)
AST: 17 U/L (ref 0–37)
Alkaline Phosphatase: 66 U/L (ref 39–117)
CO2: 26 mEq/L (ref 19–32)
Calcium: 9 mg/dL (ref 8.4–10.5)
Chloride: 95 mEq/L — ABNORMAL LOW (ref 96–112)
GFR calc non Af Amer: >90 mL/min (ref >90)
Glucose, Bld: 107 mg/dL — ABNORMAL HIGH (ref 70–99)
Potassium: 4.4 mEq/L (ref 3.5–5.1)
Sodium: 130 mEq/L — ABNORMAL LOW (ref 135–145)
Total Bilirubin: 0.3 mg/dL (ref 0.3–1.2)

## 2012-06-21 LAB — TYPE AND SCREEN
ABO/RH(D): O POS
Antibody Screen: NEGATIVE

## 2012-06-21 LAB — PROTIME-INR
INR: 0.91 (ref 0.00–1.49)
Prothrombin Time: 12.2 seconds (ref 11.6–15.2)

## 2012-06-21 LAB — CBC
Hemoglobin: 13.8 g/dL (ref 13.0–17.0)
Platelets: 279 10*3/uL (ref 150–400)
RBC: 4.37 MIL/uL (ref 4.22–5.81)
WBC: 10.5 10*3/uL (ref 4.0–10.5)

## 2012-06-21 LAB — URINALYSIS, ROUTINE W REFLEX MICROSCOPIC
Glucose, UA: NEGATIVE mg/dL
Ketones, ur: NEGATIVE mg/dL
Leukocytes, UA: NEGATIVE
Protein, ur: NEGATIVE mg/dL
Urobilinogen, UA: 0.2 mg/dL (ref 0.0–1.0)

## 2012-06-21 LAB — APTT: aPTT: 32 seconds (ref 24–37)

## 2012-06-21 MED ORDER — VANCOMYCIN HCL IN DEXTROSE 1-5 GM/200ML-% IV SOLN
1000.0000 mg | INTRAVENOUS | Status: AC
  Administered 2012-06-22: 1000 mg via INTRAVENOUS
  Filled 2012-06-21: qty 200

## 2012-06-21 NOTE — Pre-Procedure Instructions (Signed)
[address]  06/21/2012   Your procedure is scheduled on: Thursday, December 12th.  Report to [facility] at 7:30AM.  Call this number if you have problems the morning of surgery: [phone]   Remember: Nothing to eat or drink after Midnight.     Take these medicines the morning of surgery with A SIP OF WATER: Amlodipine (Norvasc), Metoprolol (Toprol XL).    Do not wear jewelry, make-up or nail polish.  Do not wear lotions, powders, or perfumes. You may wear deodorant.  Do not shave 48 hours prior to surgery. Men may shave face and neck.  Do not bring valuables to the hospital.  Contacts, dentures or bridgework may not be worn into surgery.  Leave suitcase in the car. After surgery it may be brought to your room.  For patients admitted to the hospital, checkout time is 11:00 AM the day of discharge.   Patients discharged the day of surgery will not be allowed to drive home.  Name and phone number of your driver: NA    Special Instructions: Shower with CHG wash (Bactoshield) tonight and again in the am prior to arriving to hospital.   Please read over the following fact sheets that you were given: Pain Booklet, Coughing and Deep Breathing, Blood Transfusion Information and Surgical Site Infection Prevention

## 2012-06-22 ENCOUNTER — Ambulatory Visit (HOSPITAL_COMMUNITY): Payer: Managed Care, Other (non HMO) | Admitting: Anesthesiology

## 2012-06-22 ENCOUNTER — Encounter (HOSPITAL_COMMUNITY): Payer: Self-pay | Admitting: Anesthesiology

## 2012-06-22 ENCOUNTER — Encounter (HOSPITAL_COMMUNITY): Payer: Self-pay | Admitting: *Deleted

## 2012-06-22 ENCOUNTER — Inpatient Hospital Stay (HOSPITAL_COMMUNITY)
Admission: RE | Admit: 2012-06-22 | Discharge: 2012-06-23 | DRG: 039 | Disposition: A | Discharge status: Home / Self Care | Payer: Managed Care, Other (non HMO) | Source: Ambulatory Visit | Attending: Surgery | Admitting: Surgery

## 2012-06-22 ENCOUNTER — Encounter (HOSPITAL_COMMUNITY)
Admission: RE | Disposition: A | Discharge status: Home / Self Care | Payer: Self-pay | Source: Ambulatory Visit | Attending: Surgery

## 2012-06-22 ENCOUNTER — Inpatient Hospital Stay (HOSPITAL_COMMUNITY): Payer: Managed Care, Other (non HMO)

## 2012-06-22 DIAGNOSIS — Z8673 Personal history of transient ischemic attack (TIA), and cerebral infarction without residual deficits: Secondary | ICD-10-CM

## 2012-06-22 DIAGNOSIS — J449 Chronic obstructive pulmonary disease, unspecified: Secondary | ICD-10-CM | POA: Diagnosis present

## 2012-06-22 DIAGNOSIS — J4489 Other specified chronic obstructive pulmonary disease: Secondary | ICD-10-CM | POA: Diagnosis present

## 2012-06-22 DIAGNOSIS — Z79899 Other long term (current) drug therapy: Secondary | ICD-10-CM

## 2012-06-22 DIAGNOSIS — F17200 Nicotine dependence, unspecified, uncomplicated: Secondary | ICD-10-CM | POA: Diagnosis present

## 2012-06-22 DIAGNOSIS — I10 Essential (primary) hypertension: Secondary | ICD-10-CM | POA: Diagnosis present

## 2012-06-22 DIAGNOSIS — I6529 Occlusion and stenosis of unspecified carotid artery: Secondary | ICD-10-CM

## 2012-06-22 DIAGNOSIS — Z7982 Long term (current) use of aspirin: Secondary | ICD-10-CM

## 2012-06-22 DIAGNOSIS — E7800 Pure hypercholesterolemia, unspecified: Secondary | ICD-10-CM | POA: Diagnosis present

## 2012-06-22 HISTORY — PX: PATCH ANGIOPLASTY: SHX6230

## 2012-06-22 HISTORY — PX: ENDARTERECTOMY: SHX5162

## 2012-06-22 HISTORY — PX: CAROTID ENDARTERECTOMY: SUR193

## 2012-06-22 SURGERY — ENDARTERECTOMY, CAROTID
Anesthesia: General | Site: Neck | Laterality: Left | Wound class: Clean

## 2012-06-22 MED ORDER — SENNOSIDES-DOCUSATE SODIUM 8.6-50 MG PO TABS
1.0000 | ORAL_TABLET | Freq: Every evening | ORAL | Status: DC | PRN
  Filled 2012-06-22: qty 1

## 2012-06-22 MED ORDER — EPHEDRINE SULFATE 50 MG/ML IJ SOLN
INTRAMUSCULAR | Status: DC | PRN
  Administered 2012-06-22 (×2): 5 mg via INTRAVENOUS
  Administered 2012-06-22: 10 mg via INTRAVENOUS

## 2012-06-22 MED ORDER — 0.9 % SODIUM CHLORIDE (POUR BTL) OPTIME
TOPICAL | Status: DC | PRN
  Administered 2012-06-22: 1000 mL

## 2012-06-22 MED ORDER — ROCURONIUM BROMIDE 100 MG/10ML IV SOLN
INTRAVENOUS | Status: DC | PRN
  Administered 2012-06-22: 40 mg via INTRAVENOUS

## 2012-06-22 MED ORDER — ONDANSETRON HCL 4 MG/2ML IJ SOLN: 4.0000 mg | Freq: Once | INTRAMUSCULAR | Status: DC | PRN

## 2012-06-22 MED ORDER — PROTAMINE SULFATE 10 MG/ML IV SOLN
INTRAVENOUS | Status: DC | PRN
  Administered 2012-06-22: 10 mg via INTRAVENOUS
  Administered 2012-06-22: 40 mg via INTRAVENOUS

## 2012-06-22 MED ORDER — SODIUM CHLORIDE 0.9 % IV SOLN
INTRAVENOUS | Status: DC
  Administered 2012-06-22: 12:00:00 via INTRAVENOUS

## 2012-06-22 MED ORDER — MAGNESIUM SULFATE 40 MG/ML IJ SOLN
2.0000 g | Freq: Once | INTRAMUSCULAR | Status: AC | PRN
  Filled 2012-06-22: qty 50

## 2012-06-22 MED ORDER — METOPROLOL SUCCINATE ER 25 MG PO TB24: 25.0000 mg | ORAL_TABLET | Freq: Every day | ORAL | Status: DC

## 2012-06-22 MED ORDER — LABETALOL HCL 5 MG/ML IV SOLN
INTRAVENOUS | Status: DC | PRN
  Administered 2012-06-22: 5 mg via INTRAVENOUS
  Administered 2012-06-22: 10 mg via INTRAVENOUS
  Administered 2012-06-22: 5 mg via INTRAVENOUS
  Administered 2012-06-22: 10 mg via INTRAVENOUS

## 2012-06-22 MED ORDER — ACETAMINOPHEN 10 MG/ML IV SOLN
1000.0000 mg | Freq: Once | INTRAVENOUS | Status: AC | PRN
  Administered 2012-06-22: 1000 mg via INTRAVENOUS

## 2012-06-22 MED ORDER — ONDANSETRON HCL 4 MG/2ML IJ SOLN: 4.0000 mg | Freq: Four times a day (QID) | INTRAMUSCULAR | Status: DC | PRN

## 2012-06-22 MED ORDER — LABETALOL HCL 5 MG/ML IV SOLN: 10.0000 mg | INTRAVENOUS | Status: DC | PRN

## 2012-06-22 MED ORDER — LACTATED RINGERS IV SOLN
INTRAVENOUS | Status: DC | PRN
  Administered 2012-06-22 (×2): via INTRAVENOUS

## 2012-06-22 MED ORDER — HEMOSTATIC AGENTS (NO CHARGE) OPTIME
TOPICAL | Status: DC | PRN
  Administered 2012-06-22 (×2): 1 via TOPICAL

## 2012-06-22 MED ORDER — PROPOFOL 10 MG/ML IV BOLUS
INTRAVENOUS | Status: DC | PRN
  Administered 2012-06-22: 130 mg via INTRAVENOUS

## 2012-06-22 MED ORDER — PANTOPRAZOLE SODIUM 40 MG PO TBEC: 40.0000 mg | DELAYED_RELEASE_TABLET | Freq: Every day | ORAL | Status: DC

## 2012-06-22 MED ORDER — BISACODYL 10 MG RE SUPP: 10.0000 mg | Freq: Every day | RECTAL | Status: DC | PRN

## 2012-06-22 MED ORDER — FENTANYL CITRATE 0.05 MG/ML IJ SOLN
INTRAMUSCULAR | Status: DC | PRN
  Administered 2012-06-22 (×3): 100 ug via INTRAVENOUS
  Administered 2012-06-22 (×3): 50 ug via INTRAVENOUS

## 2012-06-22 MED ORDER — HYDROMORPHONE HCL PF 1 MG/ML IJ SOLN: 0.2500 mg | INTRAMUSCULAR | Status: DC | PRN

## 2012-06-22 MED ORDER — ACETAMINOPHEN 325 MG PO TABS: 325.0000 mg | ORAL_TABLET | ORAL | Status: DC | PRN

## 2012-06-22 MED ORDER — SODIUM CHLORIDE 0.9 % IV SOLN: 500.0000 mL | Freq: Once | INTRAVENOUS | Status: AC | PRN

## 2012-06-22 MED ORDER — NEOSTIGMINE METHYLSULFATE 1 MG/ML IJ SOLN
INTRAMUSCULAR | Status: DC | PRN
  Administered 2012-06-22: 3 mg via INTRAVENOUS

## 2012-06-22 MED ORDER — ALUM & MAG HYDROXIDE-SIMETH 200-200-20 MG/5ML PO SUSP: 15.0000 mL | ORAL | Status: DC | PRN

## 2012-06-22 MED ORDER — GLYCOPYRROLATE 0.2 MG/ML IJ SOLN
INTRAMUSCULAR | Status: DC | PRN
  Administered 2012-06-22: 0.4 mg via INTRAVENOUS

## 2012-06-22 MED ORDER — VANCOMYCIN HCL IN DEXTROSE 1-5 GM/200ML-% IV SOLN
1000.0000 mg | Freq: Once | INTRAVENOUS | Status: AC
  Administered 2012-06-22: 1000 mg via INTRAVENOUS
  Filled 2012-06-22: qty 200

## 2012-06-22 MED ORDER — GUAIFENESIN-DM 100-10 MG/5ML PO SYRP: 15.0000 mL | ORAL_SOLUTION | ORAL | Status: DC | PRN

## 2012-06-22 MED ORDER — HYDRALAZINE HCL 20 MG/ML IJ SOLN
INTRAMUSCULAR | Status: AC
  Filled 2012-06-22: qty 1

## 2012-06-22 MED ORDER — ONDANSETRON HCL 4 MG/2ML IJ SOLN
INTRAMUSCULAR | Status: DC | PRN
  Administered 2012-06-22: 4 mg via INTRAVENOUS

## 2012-06-22 MED ORDER — CARVEDILOL 6.25 MG PO TABS: 6.2500 mg | ORAL_TABLET | Freq: Two times a day (BID) | ORAL | Status: DC

## 2012-06-22 MED ORDER — HYDROCHLOROTHIAZIDE 12.5 MG PO CAPS
12.5000 mg | ORAL_CAPSULE | Freq: Every day | ORAL | Status: DC
  Administered 2012-06-22: 12.5 mg via ORAL
  Filled 2012-06-22 (×2): qty 1

## 2012-06-22 MED ORDER — ACETAMINOPHEN 650 MG RE SUPP: 325.0000 mg | RECTAL | Status: DC | PRN

## 2012-06-22 MED ORDER — METOPROLOL TARTRATE 1 MG/ML IV SOLN: 2.0000 mg | INTRAVENOUS | Status: DC | PRN

## 2012-06-22 MED ORDER — HYDRALAZINE HCL 20 MG/ML IJ SOLN
10.0000 mg | INTRAMUSCULAR | Status: DC | PRN
  Administered 2012-06-22 (×2): 10 mg via INTRAVENOUS
  Filled 2012-06-22: qty 1

## 2012-06-22 MED ORDER — PHENOL 1.4 % MT LIQD
1.0000 | OROMUCOSAL | Status: DC | PRN
  Administered 2012-06-22: 1 via OROMUCOSAL
  Filled 2012-06-22: qty 177

## 2012-06-22 MED ORDER — VALSARTAN-HYDROCHLOROTHIAZIDE 320-12.5 MG PO TABS
1.0000 | ORAL_TABLET | Freq: Every day | ORAL | Status: DC
Start: 2012-06-22 — End: 2012-06-22

## 2012-06-22 MED ORDER — HEPARIN SODIUM (PORCINE) 1000 UNIT/ML IJ SOLN: INTRAMUSCULAR | Status: DC | PRN

## 2012-06-22 MED ORDER — CLOPIDOGREL BISULFATE 75 MG PO TABS
75.0000 mg | ORAL_TABLET | Freq: Every day | ORAL | Status: DC
Start: 2012-06-22 — End: 2012-06-23
  Filled 2012-06-22 (×2): qty 1

## 2012-06-22 MED ORDER — LIDOCAINE HCL (CARDIAC) 20 MG/ML IV SOLN
INTRAVENOUS | Status: DC | PRN
  Administered 2012-06-22: 30 mg via INTRAVENOUS

## 2012-06-22 MED ORDER — DOPAMINE-DEXTROSE 3.2-5 MG/ML-% IV SOLN: 3.0000 ug/kg/min | INTRAVENOUS | Status: DC

## 2012-06-22 MED ORDER — MORPHINE SULFATE 2 MG/ML IJ SOLN: 2.0000 mg | INTRAMUSCULAR | Status: DC | PRN

## 2012-06-22 MED ORDER — DOCUSATE SODIUM 100 MG PO CAPS: 100.0000 mg | ORAL_CAPSULE | Freq: Every day | ORAL | Status: DC

## 2012-06-22 MED ORDER — SODIUM CHLORIDE 0.9 % IV SOLN: INTRAVENOUS | Status: DC

## 2012-06-22 MED ORDER — POTASSIUM CHLORIDE CRYS ER 20 MEQ PO TBCR: 20.0000 meq | EXTENDED_RELEASE_TABLET | Freq: Once | ORAL | Status: AC | PRN

## 2012-06-22 MED ORDER — AMLODIPINE BESYLATE 5 MG PO TABS
5.0000 mg | ORAL_TABLET | Freq: Every day | ORAL | Status: DC
  Administered 2012-06-22: 5 mg via ORAL
  Filled 2012-06-22 (×2): qty 1

## 2012-06-22 MED ORDER — ACETAMINOPHEN 10 MG/ML IV SOLN
INTRAVENOUS | Status: AC
  Filled 2012-06-22: qty 100

## 2012-06-22 MED ORDER — LIDOCAINE HCL (PF) 1 % IJ SOLN
INTRAMUSCULAR | Status: AC
  Filled 2012-06-22: qty 30

## 2012-06-22 MED ORDER — ASPIRIN EC 325 MG PO TBEC: 325.0000 mg | DELAYED_RELEASE_TABLET | Freq: Every day | ORAL | Status: DC

## 2012-06-22 MED ORDER — IRBESARTAN 300 MG PO TABS
300.0000 mg | ORAL_TABLET | Freq: Every day | ORAL | Status: DC
  Administered 2012-06-22: 300 mg via ORAL
  Filled 2012-06-22 (×2): qty 1

## 2012-06-22 MED ORDER — ATORVASTATIN CALCIUM 40 MG PO TABS
40.0000 mg | ORAL_TABLET | Freq: Every day | ORAL | Status: DC
  Administered 2012-06-22: 40 mg via ORAL
  Filled 2012-06-22 (×2): qty 1

## 2012-06-22 MED ORDER — ASPIRIN EC 81 MG PO TBEC
81.0000 mg | DELAYED_RELEASE_TABLET | Freq: Every day | ORAL | Status: DC
Start: 2012-06-22 — End: 2012-06-22

## 2012-06-22 MED ORDER — OXYCODONE-ACETAMINOPHEN 5-325 MG PO TABS: 1.0000 | ORAL_TABLET | ORAL | Status: DC | PRN

## 2012-06-22 MED ORDER — CARVEDILOL 12.5 MG PO TABS
12.5000 mg | ORAL_TABLET | Freq: Two times a day (BID) | ORAL | Status: DC
  Administered 2012-06-22 – 2012-06-23 (×2): 12.5 mg via ORAL
  Filled 2012-06-22 (×4): qty 1

## 2012-06-22 MED ORDER — SODIUM CHLORIDE 0.9 % IR SOLN
Status: DC | PRN
  Administered 2012-06-22: 08:00:00

## 2012-06-22 MED ORDER — HEPARIN SODIUM (PORCINE) 1000 UNIT/ML IJ SOLN
INTRAMUSCULAR | Status: DC | PRN
  Administered 2012-06-22: 2 mL via INTRAVENOUS
  Administered 2012-06-22: 1 mL via INTRAVENOUS
  Administered 2012-06-22: 8 mL via INTRAVENOUS

## 2012-06-22 SURGICAL SUPPLY — 52 items
CANISTER SUCTION 2500CC (MISCELLANEOUS) ×2 IMPLANT
CATH ROBINSON RED A/P 18FR (CATHETERS) ×2 IMPLANT
CATH SUCT 10FR WHISTLE TIP (CATHETERS) ×2 IMPLANT
CLIP TI MEDIUM 24 (CLIP) ×2 IMPLANT
CLIP TI WIDE RED SMALL 24 (CLIP) ×2 IMPLANT
CLOTH BEACON ORANGE TIMEOUT ST (SAFETY) ×2 IMPLANT
COVER SURGICAL LIGHT HANDLE (MISCELLANEOUS) ×2 IMPLANT
CRADLE DONUT ADULT HEAD (MISCELLANEOUS) ×2 IMPLANT
DERMABOND ADVANCED (GAUZE/BANDAGES/DRESSINGS) ×1
DERMABOND ADVANCED .7 DNX12 (GAUZE/BANDAGES/DRESSINGS) ×1 IMPLANT
DRAIN CHANNEL 15F RND FF W/TCR (WOUND CARE) IMPLANT
DRAPE WARM FLUID 44X44 (DRAPE) ×2 IMPLANT
ELECT REM PT RETURN 9FT ADLT (ELECTROSURGICAL) ×2
ELECTRODE REM PT RTRN 9FT ADLT (ELECTROSURGICAL) ×1 IMPLANT
EVACUATOR SILICONE 100CC (DRAIN) IMPLANT
GLOVE BIOGEL PI IND STRL 6.5 (GLOVE) ×2 IMPLANT
GLOVE BIOGEL PI IND STRL 7.5 (GLOVE) ×4 IMPLANT
GLOVE BIOGEL PI INDICATOR 6.5 (GLOVE) ×2
GLOVE BIOGEL PI INDICATOR 7.5 (GLOVE) ×4
GLOVE SS BIOGEL STRL SZ 7 (GLOVE) ×1 IMPLANT
GLOVE SUPERSENSE BIOGEL SZ 7 (GLOVE) ×1
GLOVE SURG SS PI 6.5 STRL IVOR (GLOVE) ×4 IMPLANT
GLOVE SURG SS PI 7.5 STRL IVOR (GLOVE) ×6 IMPLANT
GOWN PREVENTION PLUS XLARGE (GOWN DISPOSABLE) ×6 IMPLANT
GOWN PREVENTION PLUS XXLARGE (GOWN DISPOSABLE) ×2 IMPLANT
GOWN STRL NON-REIN LRG LVL3 (GOWN DISPOSABLE) ×4 IMPLANT
HEMOSTAT SNOW SURGICEL 2X4 (HEMOSTASIS) ×4 IMPLANT
HEMOSTAT SURGICEL 2X14 (HEMOSTASIS) IMPLANT
INSERT FOGARTY SM (MISCELLANEOUS) IMPLANT
KIT BASIN OR (CUSTOM PROCEDURE TRAY) ×2 IMPLANT
KIT ROOM TURNOVER OR (KITS) ×2 IMPLANT
NS IRRIG 1000ML POUR BTL (IV SOLUTION) ×4 IMPLANT
PACK CAROTID (CUSTOM PROCEDURE TRAY) ×2 IMPLANT
PAD ARMBOARD 7.5X6 YLW CONV (MISCELLANEOUS) ×4 IMPLANT
PATCH VASCULAR VASCU GUARD 1X6 (Vascular Products) ×2 IMPLANT
SHUNT CAROTID BYPASS 10 (VASCULAR PRODUCTS) IMPLANT
SHUNT CAROTID BYPASS 12FRX15.5 (VASCULAR PRODUCTS) IMPLANT
SPECIMEN JAR SMALL (MISCELLANEOUS) ×2 IMPLANT
SPONGE INTESTINAL PEANUT (DISPOSABLE) ×2 IMPLANT
SUT ETHILON 3 0 PS 1 (SUTURE) IMPLANT
SUT PROLENE 6 0 BV (SUTURE) ×10 IMPLANT
SUT PROLENE 6 0 CC (SUTURE) ×2 IMPLANT
SUT PROLENE 7 0 BV 1 (SUTURE) IMPLANT
SUT PROLENE 7 0 BV1 MDA (SUTURE) ×2 IMPLANT
SUT SILK 3 0 (SUTURE) ×1
SUT SILK 3-0 18XBRD TIE 12 (SUTURE) ×1 IMPLANT
SUT VIC AB 3-0 SH 27 (SUTURE) ×3
SUT VIC AB 3-0 SH 27X BRD (SUTURE) ×3 IMPLANT
SUT VICRYL 4-0 PS2 18IN ABS (SUTURE) ×2 IMPLANT
TOWEL OR 17X24 6PK STRL BLUE (TOWEL DISPOSABLE) ×2 IMPLANT
TOWEL OR 17X26 10 PK STRL BLUE (TOWEL DISPOSABLE) ×2 IMPLANT
WATER STERILE IRR 1000ML POUR (IV SOLUTION) ×2 IMPLANT

## 2012-06-22 NOTE — Interval H&P Note (Signed)
History and Physical Interval Note:  06/22/2012 7:30 AM  Darrell Willis  has presented today for surgery, with the diagnosis of Left Internal Carotid Stenosis  The various methods of treatment have been discussed with the patient and family. After consideration of risks, benefits and other options for treatment, the patient has consented to  Procedure(s) (LRB) with comments: ENDARTERECTOMY CAROTID (Left) as a surgical intervention .  The patient's history has been reviewed, patient examined, no change in status, stable for surgery.  I have reviewed the patient's chart and labs.  Questions were answered to the patient's satisfaction.     [name] IV, V. WELLS

## 2012-06-22 NOTE — Care Management Note (Unsigned)
    Page 1 of 1   06/22/2012     1:50:32 PM   CARE MANAGEMENT NOTE 06/22/2012  Patient:  Darrell Willis, Darrell Willis   Account Number:  [id number]  Date Initiated:  06/22/2012  Documentation initiated by:  GRAVES-BIGELOW,Meryem Haertel  Subjective/Objective Assessment:   Pt admitted for carotid endarterectomy.     Action/Plan:   CM will continue to f/u for disposition needs if any.   Anticipated DC Date:  06/23/2012   Anticipated DC Plan:  HOME/SELF CARE      DC Planning Services  CM consult      Choice offered to / List presented to:             Status of service:  In process, will continue to follow Medicare Important Message given?   (If response is "NO", the following Medicare IM given date fields will be blank) Date Medicare IM given:   Date Additional Medicare IM given:    Discharge Disposition:    Per UR Regulation:  Reviewed for med. necessity/level of care/duration of stay  If discussed at Long Length of Stay Meetings, dates discussed:    Comments:

## 2012-06-22 NOTE — Op Note (Signed)
[facility]  Patient name: Darrell Willis MRN: [id number] DOB: [birth year] Sex: male  06/22/2012 Pre-operative Diagnosis: Symptomatic Left carotid stenosis Post-operative diagnosis:  Same Surgeon:  Jorge Ny Assistants:  M.Collins Procedure:    left carotid Endarterectomy with bovine pericardial patch angioplasty Anesthesia:  General Blood Loss:  See anesthesia record Specimens:  Carotid Plaque to pathology  Findings:  95 %stenosis; Thrombus:  none  Indications:  66 yo male recently s/p left brain stroke.  U/s and CTA revealed a left brain stroke.  He has minimal residual deficits.  He has undergone pre-operative cardiac evaluation including cardiac cath and was cleared, although at some risk.  Procedure:  The patient was identified in the holding area and taken to [facility] OR ROOM 12  The patient was then placed supine on the table.   General endotrachial anesthesia was administered.  The patient was prepped and draped in the usual sterile fashion.  A time out was called and antibiotics were administered.  The incision was made along the anterior border of the left sternocleidomastoid muscle.  Cautery was used to dissect through the subcutaneous tissue.  The platysma muscle was divided with cautery.  The internal jugular vein was exposed along its anterior medial border.  The common facial vein was exposed and then divided between 2-0 silk ties and metal clips.  The common carotid artery was then circumferentially exposed and encircled with an umbilical tape.  The vagus nerve was identified and protected.  Next sharp dissection was used to expose the external carotid artery and the superior thyroid artery.  The were encircled with a blue vessel loop and a 2-0 silk tie respectively.  Finally, the internal carotid was carefully dissected free.  An umbilical tape was placed around the internal carotid artery distal to the diseased segment.  The hypoglossal nerve was  visualized throughout and protected.  The patient was given systemic heparinization.  A bovine carotid patch was selected and prepared on the back table.  A 10 french shunt was also prepared.  After blood pressure readings were appropriate and the heparin had been given time to circulate, the internal carotid artery was occluded with a baby Gregory clamp.  The external and common carotid arteries were then occluded with vascular clamps and the 2-0 tie tightened on the superior thyroid artery.  A #11 blade was used to make an arteriotomy in the common carotid artery.  This was extended with Potts scissors along the anterior and lateral border of the common and internal carotid artery.  Approximately 95% stenosis was identified.  There was no thrombus identified.  The shunt was not  Placed due to how high the lesion was.  There was however good backbleeding .  A kleiner kuntz elevator was used to perform endarterectomy.  An eversion endarterectomy was performed in the external carotid artery.  A good distal endpoint was obtained in the internal carotid artery.  The specimen was removed and sent to pathology.  Heparinized saline was used to irrigate the endarterectomized field.  All potential embolic debris was removed.  Bovine pericardial patch angioplasty was then performed using a running 6-0 Prolene. The common internal and external carotid arteries were all appropriately flushed. The artery was again irrigated with heparin saline.  The anastomosis was then secured. The clamp was first released on the external carotid artery followed by the common carotid artery approximately 30 seconds later, bloodflow was reestablish through the internal carotid artery.  Next, a hand-held  Doppler was used to evaluate the signals in the common, external, and internal  carotid arteries, all of which had appropriate signals. I then administered  50 mg protamine. The wound was then irrigated.  After hemostasis was achieved, the  carotid sheath was reapproximated with 3-0 Vicryl. The  platysma muscle was reapproximated with running 3-0 Vicryl. The skin  was closed with 4-0 Vicryl. Dermabond was placed on the skin. The  patient was then successfully extubated. His neurologic exam was  similar to his preprocedural exam. The patient was then taken to recovery room  in stable condition. There were no complications.     Disposition:  To PACU in stable condition.  Relevant Operative Details:  The bifurcation was in the mid neck.  The superior thyroid artery had to be ligated because it crossed over the bifurcation and was in the way.  The hypoglossal was large and was easily identified and protected.  The common carotid and internal carotid artery were larger than normal size.  The plaque in the internal carotid artery was nearly occlusive and very long.  It extended aproximately 4cm cephalad.  The plaque extended well underneath the hypoglossal nerve, and therefore I could not place a shunt.  There was good backbleeding from the internal carotid artery.The distal endpoint of the plaque went up to the clamp on the internal carotid artery.  I placed the internal carotid clamp as high as I possible could, and this was barely beyond the distal extent of the plaque.  I was able to get a good endpoint.  The patient awoke from the procedure neurologically intact.  Juleen China, M.D. [facility] [place] Office: [phone] Pager:  [phone]

## 2012-06-22 NOTE — Progress Notes (Signed)
Patient unable to void. Bladder scanner showed greater than 750cc.Notified MD and received order to place foley catheter and dc it in the am. Foley placed with urine return. Pt tolerated well. Will continue to monitor output.

## 2012-06-22 NOTE — Anesthesia Preprocedure Evaluation (Addendum)
Anesthesia Evaluation  Patient identified by MRN, date of birth, ID band Patient awake    Reviewed: Allergy & Precautions, H&P , NPO status , Patient's Chart, lab work & pertinent test results, reviewed documented beta blocker date and time   History of Anesthesia Complications Negative for: history of anesthetic complications  Airway Mallampati: II      Dental  (+) Teeth Intact, Poor Dentition and Loose,    Pulmonary COPDCurrent Smoker,  breath sounds clear to auscultation        Cardiovascular hypertension, Pt. on medications and Pt. on home beta blockers + Peripheral Vascular Disease Rhythm:Regular Rate:Normal  Ef 35-45 % global HK   No ischemia    Neuro/Psych Right sided weakness and hand numbness  CVA, Residual Symptoms    GI/Hepatic   Endo/Other    Renal/GU      Musculoskeletal   Abdominal   Peds  Hematology   Anesthesia Other Findings   Reproductive/Obstetrics                         Anesthesia Physical Anesthesia Plan  ASA: III  Anesthesia Plan: General   Post-op Pain Management:    Induction: Intravenous  Airway Management Planned: Oral ETT  Additional Equipment: Arterial line  Intra-op Plan:   Post-operative Plan: Extubation in OR  Informed Consent: I have reviewed the patients History and Physical, chart, labs and discussed the procedure including the risks, benefits and alternatives for the proposed anesthesia with the patient or authorized representative who has indicated his/her understanding and acceptance.   Dental advisory given  Plan Discussed with: CRNA  Anesthesia Plan Comments: (Mult loose teeth  S/P L. Brain CVA Smoker Htn CAD proximal RCA stenosis treated medically 35% no ischemia on recent myoview)     Anesthesia Quick Evaluation

## 2012-06-22 NOTE — Progress Notes (Signed)
Pt admitted to 3300 from PACU. Oriented to unit and room. Safety discussed and call bell with in reach. VSS.   Elijah Birk, RN

## 2012-06-22 NOTE — H&P (View-Only) (Signed)
[facility] [place]   Patient name: Darrell Willis MRN: [id number] DOB: Nov 08, 1945 Sex: male   Referred by: Dr. Samuel Germany  Reason for referral:  Chief Complaint  Patient presents with  . Carotid    New pt    HISTORY OF PRESENT ILLNESS: The patient is an adult for today's clinic for evaluation of carotid stenosis in the setting of its recent stroke. This is a 66 year old gentleman who was no medical problems prior to November 18. At that time he developed right hand numbness as well as trouble straightening his fingers. He was admitted to the hospital with an acute stroke. His initial CT scan of the head was negative however a MRI revealed acute left middle cerebral artery infarcts involving the left frontal and parietal lobe. There was a tiny left parietal acute infarct. He also underwent a carotid duplex ultrasound which showed greater than 70% left carotid stenosis and 70% right carotid stenosis. The patient is back to his normal neurologic status. He is now taking 3 antihypertensive medications. He is also on a statin for elevated cholesterol. He is taking Plavix as well.  Past Medical History  Diagnosis Date  . Carotid artery occlusion   . Right arm weakness   . Stroke 05/29/2012    No past surgical history on file.  History   Social History  . Marital Status: Married    Spouse Name: N/A    Number of Children: N/A  . Years of Education: N/A   Occupational History  . Not on file.   Social History Main Topics  . Smoking status: Current Every Day Smoker -- 1.0 packs/day for 45 years    Types: Cigarettes  . Smokeless tobacco: Never Used  . Alcohol Use: 27.0 oz/week    45 Cans of beer per week  . Drug Use: No  . Sexually Active: Not on file   Other Topics Concern  . Not on file   Social History Narrative  . No narrative on file    No family history on file.  Allergies as of 06/05/2012 - Review Complete 06/05/2012  Allergen Reaction Noted  .  Penicillins  06/05/2012    Current Outpatient Prescriptions on File Prior to Visit  Medication Sig Dispense Refill  . atorvastatin (LIPITOR) 10 MG tablet Take 10 mg by mouth daily.      . metoprolol succinate (TOPROL-XL) 25 MG 24 hr tablet Take 25 mg by mouth daily.      . valsartan-hydrochlorothiazide (DIOVAN-HCT) 320-12.5 MG per tablet Take 1 tablet by mouth daily.         REVIEW OF SYSTEMS: Please see history of present illness. All of systems are negative  PHYSICAL EXAMINATION: General: The patient appears their stated age.  Vital signs are BP 165/85  Pulse 80  Resp 16  Ht 5\' 6"  (1.676 m)  Wt 156 lb (70.761 kg)  BMI 25.18 kg/m2  SpO2 100% HEENT:  No gross abnormalities Pulmonary: Respirations are non-labored Abdomen: Soft and non-tender . Aorta feels non-aneurysmal Musculoskeletal: There are no major deformities.   Neurologic: No focal weakness or paresthesias are detected, Skin: There are no ulcer or rashes noted. Psychiatric: The patient has normal affect. Cardiovascular: There is a regular rate and rhythm without significant murmur appreciated. No carotid bruits  Diagnostic Studies: Ultrasound was repeated today this shows 40-59% bilateral carotid stenosis  Outside Studies/Documentation Historical records were reviewed.  They showed left brain stroke, carotid stenosis  Medication Changes: I placed the patient back on  an 81 mg aspirin in addition to his Plavix  Assessment:  Left brain stroke Plan: Ultrasound in my office today revealed stenosis of the left carotid artery that is not as significant as the stenosis obtained at the hospital. For definitive recommendations can be made I need to better define the degree of stenosis within the left carotid artery. For that reason I scheduled the patient for a CT angiogram to be performed in the immediate future. Should the patient be found to have a moderate to high-grade left carotid stenosis, we would proceed with carotid  endarterectomy. If the stenosis is found to be less than 50%, I would pursue medical therapy only. I did discuss the details of the operation including the risks and benefits. We discussed the risk of stroke as well as the  Risk of nerve injury. We also discussed the risk of bleeding. I do not want him to stop his aspirin or Plavix prior to any operation. I have tentatively scheduled him for a left carotid endarterectomy on Friday, December 6. I will cancel his operation if the CT scan does not show significant left carotid stenosis.      Jorge Ny, M.D. [facility] [place] Office: [phone] Pager:  [phone]

## 2012-06-22 NOTE — Progress Notes (Signed)
UR Completed Makaylee Spielberg Graves-Bigelow, RN,BSN [phone]  

## 2012-06-22 NOTE — Transfer of Care (Signed)
Immediate Anesthesia Transfer of Care Note  Patient: Darrell Willis  Procedure(s) Performed: Procedure(s) (LRB) with comments: ENDARTERECTOMY CAROTID (Left) PATCH ANGIOPLASTY (Left) - Vascu-Guard Patch Angioplasty  Patient Location: PACU  Anesthesia Type:General  Level of Consciousness: awake, alert  and patient cooperative  Airway & Oxygen Therapy: Patient Spontanous Breathing and Patient connected to nasal cannula oxygen  Post-op Assessment: Report given to PACU RN, Post -op Vital signs reviewed and stable, Patient moving all extremities X 4 and Patient able to stick tongue midline  Post vital signs: Reviewed and stable  Complications: No apparent anesthesia complications

## 2012-06-22 NOTE — Progress Notes (Signed)
Report given to Stephanie RN.

## 2012-06-22 NOTE — Anesthesia Postprocedure Evaluation (Signed)
  Anesthesia Post-op Note  Patient: Darrell Willis  Procedure(s) Performed: Procedure(s) (LRB) with comments: ENDARTERECTOMY CAROTID (Left) PATCH ANGIOPLASTY (Left) - Vascu-Guard Patch Angioplasty  Patient Location: PACU  Anesthesia Type:General  Level of Consciousness: awake, alert  and oriented  Airway and Oxygen Therapy: Patient Spontanous Breathing and Patient connected to nasal cannula oxygen  Post-op Pain: mild  Post-op Assessment: Post-op Vital signs reviewed and Patient's Cardiovascular Status Stable  Post-op Vital Signs: stable  Complications: No apparent anesthesia complications

## 2012-06-23 ENCOUNTER — Encounter (HOSPITAL_COMMUNITY): Payer: Self-pay | Admitting: Surgery

## 2012-06-23 ENCOUNTER — Telehealth: Payer: Self-pay | Admitting: Surgery

## 2012-06-23 LAB — CBC
MCH: 31.5 pg (ref 26.0–34.0)
MCHC: 35.4 g/dL (ref 30.0–36.0)
MCV: 89.2 fL (ref 78.0–100.0)
Platelets: 220 10*3/uL (ref 150–400)
RBC: 3.33 MIL/uL — ABNORMAL LOW (ref 4.22–5.81)
RDW: 13.3 % (ref 11.5–15.5)
WBC: 11.3 10*3/uL — ABNORMAL HIGH (ref 4.0–10.5)

## 2012-06-23 LAB — BASIC METABOLIC PANEL
Calcium: 8.3 mg/dL — ABNORMAL LOW (ref 8.4–10.5)
Creatinine, Ser: 0.78 mg/dL (ref 0.50–1.35)
GFR calc Af Amer: >90 mL/min (ref >90)
GFR calc non Af Amer: >90 mL/min (ref >90)

## 2012-06-23 MED ORDER — OXYCODONE-ACETAMINOPHEN 5-325 MG PO TABS: 1.0000 | ORAL_TABLET | ORAL | Status: DC | PRN

## 2012-06-23 NOTE — Discharge Summary (Signed)
[facility] Discharge Summary   Patient ID:  Darrell Willis MRN: [id number] DOB/AGE: 1946/04/21 66 y.o.  Admit date: 06/22/2012 Discharge date: 06/23/2012 Date of Surgery: 06/22/2012 Surgeon: Surgeon(s): Nada Libman, MD  Admission Diagnosis: Left Internal Carotid Stenosis  Discharge Diagnoses:  Left Internal Carotid Stenosis  Secondary Diagnoses: Past Medical History  Diagnosis Date  . Carotid artery occlusion   . Right arm weakness   . Stroke 05/29/2012    Procedure(s): ENDARTERECTOMY CAROTID PATCH ANGIOPLASTY  Discharged Condition: good  HPI:  The patient is an adult for today's clinic for evaluation of carotid stenosis in the setting of its recent stroke. This is a 66 year old gentleman who was no medical problems prior to November 18. At that time he developed right hand numbness as well as trouble straightening his fingers. He was admitted to the hospital with an acute stroke. His initial CT scan of the head was negative however a MRI revealed acute left middle cerebral artery infarcts involving the left frontal and parietal lobe. There was a tiny left parietal acute infarct. He also underwent a carotid duplex ultrasound which showed greater than 70% left carotid stenosis and 70% right carotid stenosis. The patient is back to his normal neurologic status. He is now taking 3 antihypertensive medications. He is also on a statin for elevated cholesterol. He is taking Plavix as well.   Hospital Course:  [name] is a 66 y.o. male is S/P Left Procedure(s): ENDARTERECTOMY CAROTID PATCH ANGIOPLASTY Extubated: POD # 0 Post-op wounds healing well Pt. Ambulating, voiding and taking PO diet without difficulty. Pt pain controlled with PO pain meds. Labs as below Complications:none  Consults:     Significant Diagnostic Studies: CBC Lab Results  Component Value Date   WBC 11.3* 06/23/2012   HGB 10.5* 06/23/2012   HCT 29.7* 06/23/2012   MCV  89.2 06/23/2012   PLT 220 06/23/2012    BMET    Component Value Date/Time   NA 129* 06/23/2012 0415   K 3.9 06/23/2012 0415   CL 97 06/23/2012 0415   CO2 22 06/23/2012 0415   GLUCOSE 130* 06/23/2012 0415   BUN 8 06/23/2012 0415   CREATININE 0.78 06/23/2012 0415   CALCIUM 8.3* 06/23/2012 0415   GFRNONAA >90 06/23/2012 0415   GFRAA >90 06/23/2012 0415   COAG Lab Results  Component Value Date   INR 0.91 06/21/2012   INR 0.85 06/06/2012     Disposition:  Discharge to :Home Discharge Orders    Future Appointments: Provider: Department: Dept Phone: Center:   06/26/2012 11:30 AM Beatrice Lecher, PA [facility] Main Office [place]) [phone] [id number]     Future Orders Please Complete By Expires   Resume previous diet      Driving Restrictions      Comments:   No driving for 2 weeks   Lifting restrictions      Comments:   No lifting for 6 weeks   Call MD for:  temperature >100.5      Call MD for:  redness, tenderness, or signs of infection (pain, swelling, bleeding, redness, odor or green/yellow discharge around incision site)      Call MD for:  severe or increased pain, loss or decreased feeling  in affected limb(s)      Increase activity slowly      Comments:   Walk with assistance use walker or cane as needed   May shower          [place], [place]  Home  Medication Instructions [id number]   Printed on:06/23/12 0739  Medication Information                    clopidogrel (PLAVIX) 75 MG tablet Take 75 mg by mouth at bedtime.            aspirin EC 81 MG tablet Take 81 mg by mouth at bedtime.            amLODipine (NORVASC) 5 MG tablet Take 5 mg by mouth at bedtime.           atorvastatin (LIPITOR) 40 MG tablet Take 40 mg by mouth at bedtime.           valsartan-hydrochlorothiazide (DIOVAN-HCT) 320-12.5 MG per tablet Take 1 tablet by mouth daily.           carvedilol (COREG) 12.5 MG tablet Take 12.5 mg by mouth 2 (two) times daily with a meal.            oxyCODONE-acetaminophen (PERCOCET/ROXICET) 5-325 MG per tablet Take 1-2 tablets by mouth every 4 (four) hours as needed.            Verbal and written Discharge instructions given to the patient. Wound care per Discharge AVS Follow-up Information    Follow up with Myra Gianotti IV, Lala Lund, MD. In 2 weeks. (sent)    Contact information:   [address] [place] [address] [phone]          Signed: Clinton Gallant [facility] 06/23/2012, 7:39 AM

## 2012-06-23 NOTE — Progress Notes (Signed)
Patient d/c'd home per MD order.  Patient's neuro assessment at baseline and assessment WNL/baseline. Patient and family given d/c instructions/prescriptions and all questions answered.  Patient d/c'd home via wheelchair with NS/MT and family.

## 2012-06-23 NOTE — Progress Notes (Signed)
VASCULAR AND VEIN SURGERY POST - OP CEA PROGRESS NOTE  Date of Surgery: 06/22/2012  Surgeon(s): Nada Libman, MD 1 Day Post-Op left Carotid Endarterectomy .  HPI: Darrell Willis is a 66 y.o. male who is 1 Day Post-Op left Carotid Endarterectomy . Patient is doing well. Pre-operative symptoms are Improved Patient denies headache; Patient denies difficulty swallowing; sore throat denies weakness in upper or lower extremities; Pt. denies other symptoms of stroke or TIA.  IMAGING: Ct Chest Wo Contrast  06/22/2012  *RADIOLOGY REPORT*  Clinical Data: Question pulmonary nodule on chest x-ray.  CT CHEST WITHOUT CONTRAST  Technique:  Multidetector CT imaging of the chest was performed following the standard protocol without IV contrast.  Comparison: Chest x-ray 06/06/2012.  Findings: The chest wall is unremarkable.  No supraclavicular or axillary mass or adenopathy.  The thyroid gland appears normal. There is a small amount of they are in the soft tissue planes of the neck.  I do not see an obvious cause for this.  There is no pneumothorax or pneumomediastinum.  The esophagus appears normal. Has this patient had a recent procedure or injection?  The bony thorax is intact.  No destructive bone lesions or spinal canal compromise.  The sternum is normal.  The thoracic vertebral bodies are normally aligned and disc spaces are maintained.  The facets are normally aligned.  The heart is normal in size.  No pericardial effusion.  No mediastinal or hilar lymphadenopathy.  There are small scattered lymph nodes noted.  The esophagus is unremarkable.  The aorta is normal in caliber.  Minimal atherosclerotic calcifications.  Dense coronary artery calcifications are noted.  Examination of the lung parenchyma demonstrates mild emphysematous changes.  No acute pulmonary process.  No pulmonary mass is identified.  The abnormality in the chest x-ray is likely due to spurring changes in the thoracic spine.  There is dependent  bibasilar atelectasis.  No pleural effusion.  There are a few small scattered pulmonary nodules.  These measure less than 4 mm.  These are seen in the right middle lobe on image number 36 and in the right lower lobe on image number 41. Recommend follow-up noncontrast chest CT in 6 months to reevaluate.  The upper abdomen and is unremarkable.  A right renal calculus is noted.  No upper abdominal mass or adenopathy.  IMPRESSION:  1.  No worrisome pulmonary mass.  The abnormality in the chest x- ray is likely due to thoracic spine osteophytes. 2.  Two small right lung nodules.  Recommend follow-up noncontrast chest CT in 6 months. 3.  Dense coronary artery calcifications. 4.  A small amount of air is noted in the lower neck region.  No obvious cause is identified.  Recommend clinical correlation.   Original Report Authenticated By: Rudie Meyer, M.D.     Significant Diagnostic Studies: CBC Lab Results  Component Value Date   WBC 11.3* 06/23/2012   HGB 10.5* 06/23/2012   HCT 29.7* 06/23/2012   MCV 89.2 06/23/2012   PLT 220 06/23/2012    BMET    Component Value Date/Time   NA 129* 06/23/2012 0415   K 3.9 06/23/2012 0415   CL 97 06/23/2012 0415   CO2 22 06/23/2012 0415   GLUCOSE 130* 06/23/2012 0415   BUN 8 06/23/2012 0415   CREATININE 0.78 06/23/2012 0415   CALCIUM 8.3* 06/23/2012 0415   GFRNONAA >90 06/23/2012 0415   GFRAA >90 06/23/2012 0415    COAG Lab Results  Component Value Date  INR 0.91 06/21/2012   INR 0.85 06/06/2012   No results found for this basename: PTT      Intake/Output Summary (Last 24 hours) at 06/23/12 0736 Last data filed at 06/23/12 0600  Gross per 24 hour  Intake 4341.67 ml  Output   2875 ml  Net 1466.67 ml    Physical Exam:  BP Readings from Last 3 Encounters:  06/23/12 162/51  06/23/12 162/51  06/21/12 178/88   Temp Readings from Last 3 Encounters:  06/23/12 98.5 F (36.9 C) Oral  06/23/12 98.5 F (36.9 C) Oral  06/21/12 98.2 F (36.8  C)    SpO2 Readings from Last 3 Encounters:  06/23/12 96%  06/23/12 96%  06/21/12 97%   Pulse Readings from Last 3 Encounters:  06/23/12 87  06/23/12 87  06/21/12 98    Pt is A&O x 3 Gait is normal Speech is fluent left Neck Wound is healing well Patient with Negative tongue deviation and Negative facial droop Pt has good and equal strength in all extremities  Assessment/Plan:: Darrell Willis is a 66 y.o. male is S/P Left Carotid endarterectomy Pt is voiding, ambulating and taking po well    Discharge to: Home Follow-up in 2 weeks   Clinton Gallant [facility] [phone] 06/23/2012 7:36 AM

## 2012-06-23 NOTE — Telephone Encounter (Addendum)
Message copied by Rosalyn Charters on Fri Jun 23, 2012  9:45 AM ------      Message from: Melene Plan      Created: Fri Jun 23, 2012  9:18 AM                   ----- Message -----         From: Lars Mage, PA         Sent: 06/23/2012   7:40 AM           To: Melene Plan, RN            F/U in 2 weeks s/p carotid left side.  notified pt.'s wife of fu appt. on 07-03-12 at 4 pm

## 2012-06-26 ENCOUNTER — Ambulatory Visit (INDEPENDENT_AMBULATORY_CARE_PROVIDER_SITE_OTHER): Payer: Managed Care, Other (non HMO) | Admitting: Physician Assistant

## 2012-06-26 ENCOUNTER — Encounter: Payer: Self-pay | Admitting: Physician Assistant

## 2012-06-26 VITALS — BP 194/88 | HR 67 | Ht 66.0 in | Wt 154.4 lb

## 2012-06-26 DIAGNOSIS — Z720 Tobacco use: Secondary | ICD-10-CM

## 2012-06-26 DIAGNOSIS — E785 Hyperlipidemia, unspecified: Secondary | ICD-10-CM

## 2012-06-26 DIAGNOSIS — F17200 Nicotine dependence, unspecified, uncomplicated: Secondary | ICD-10-CM

## 2012-06-26 DIAGNOSIS — I2589 Other forms of chronic ischemic heart disease: Secondary | ICD-10-CM

## 2012-06-26 DIAGNOSIS — I10 Essential (primary) hypertension: Secondary | ICD-10-CM

## 2012-06-26 DIAGNOSIS — I2510 Atherosclerotic heart disease of native coronary artery without angina pectoris: Secondary | ICD-10-CM

## 2012-06-26 DIAGNOSIS — I739 Peripheral vascular disease, unspecified: Secondary | ICD-10-CM

## 2012-06-26 DIAGNOSIS — I6529 Occlusion and stenosis of unspecified carotid artery: Secondary | ICD-10-CM

## 2012-06-26 DIAGNOSIS — R918 Other nonspecific abnormal finding of lung field: Secondary | ICD-10-CM

## 2012-06-26 MED ORDER — AMLODIPINE BESYLATE 10 MG PO TABS: 10.0000 mg | ORAL_TABLET | Freq: Every day | ORAL | Status: DC

## 2012-06-26 MED ORDER — CARVEDILOL 6.25 MG PO TABS: 18.7500 mg | ORAL_TABLET | Freq: Two times a day (BID) | ORAL | Status: DC

## 2012-06-26 NOTE — Progress Notes (Signed)
[address], [address] [place], [place]  [address] Phone: [phone], Fax:  [phone]  Date:  06/26/2012   Name:  Darrell Willis   DOB:  [birth year]   MRN:  [id number]  PCP:  Renae Fickle, MD  Primary Cardiologist:  Dr. Marca Ancona  Primary Electrophysiologist:  None    History of Present Illness: Darrell Willis is a 66 y.o. male who returns for follow up.  Patient had a recent CVA and saw Dr. Shirlee Latch for cardiology evaluation prior to left CEA. Patient was admitted to [facility] in 11/13 with RUE weakness. 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 Preoperative Lexiscan myoview demonstrated EF 35% but no evidence for ischemia or infarction. Echo was then done, showing EF 40% with apical hypokinesis. Cardiac catheterization was recommended.   LHC 06/16/12: dLM 20%, pLAD 30%, mLAD 70%, oCFX 40%, pOM2 50-60%, pOM3 40%, pRCA 90%, EF 50% with basal to mid inferior HK. Patient was not felt to have significant symptoms related to this high-grade RCA disease. He also did not have critical LAD stenosis. Intervention of the proximal RCA could be considered if he develops symptoms. Aggressive medical management was recommended. Patient then went for left CEA with Dr. Myra Gianotti 06/22/12. It appears that his postoperative course was fairly uneventful and he was discharged home the next day.  Overall, doing well.  The patient denies chest pain, shortness of breath, syncope, orthopnea, PND or significant pedal edema.  Of note, recent chest CT demonstrates 2 small right lung nodules.    Labs (11/13): Na 124, K 4.6, creatinine 0.81  Labs (12/13): Na 129, K 3.9, creatinine 0.78, ALT 28, Hgb 10.5  Wt Readings from Last 3 Encounters:  06/26/12 154 lb 6.4 oz (70.035 kg)  06/22/12 164 lb 10.9 oz (74.7 kg)  06/22/12 164 lb 10.9 oz (74.7 kg)    Past Medical History:   1. HTN  2. Hyperlipidemia  3. CVA: 11/13 admission to [facility] with right arm weakness and  was found to have acute CVA. L MCA infarct was found. Carotid dopplers showed > 70% bilateral ICA stenosis. CTA neck showed severe left ICA stenosis. S/p L CEA 06/2012 ([place]) 4. Active smoker, 1 ppd.  5. Cardiomyopathy: Lexiscan myoview (12/13) with EF 35%, global hypokinesis, no scar or ischemia. Echo (12/13) with EF 40-45%, mild LVH, apical hypokinesis. 6. CAD:  LHC 06/16/12: dLM 20%, pLAD 30%, mLAD 70%, oCFX 40%, pOM2 50-60%, pOM3 40%, pRCA 90%, EF 50% with basal to mid inferior HK. => Med Rx unless angina -> consider PCI of RCA 7. Lung Nodule:  Chest CT 06/22/12:  2 small R lung nodules => needs follow up CT 12/2012  Past Medical History  Diagnosis Date  . Carotid artery occlusion   . Right arm weakness   . Stroke 05/29/2012    Current Outpatient Prescriptions  Medication Sig Dispense Refill  . amLODipine (NORVASC) 5 MG tablet Take 5 mg by mouth at bedtime.      [name] aspirin EC 81 MG tablet Take 81 mg by mouth at bedtime.       [name] atorvastatin (LIPITOR) 40 MG tablet Take 40 mg by mouth at bedtime.      . carvedilol (COREG) 12.5 MG tablet Take 12.5 mg by mouth 2 (two) times daily with a meal.      . clopidogrel (PLAVIX) 75 MG tablet Take 75 mg by mouth at bedtime.       [name] oxyCODONE-acetaminophen (PERCOCET/ROXICET) 5-325 MG  per tablet Take 1-2 tablets by mouth every 4 (four) hours as needed.  30 tablet  0  . valsartan-hydrochlorothiazide (DIOVAN-HCT) 320-12.5 MG per tablet Take 1 tablet by mouth daily.        Allergies: Allergies  Allergen Reactions  . Penicillins     Social History:  The patient  reports that he has been smoking Cigarettes.  He has a 45 pack-year smoking history. He has never used smokeless tobacco. He reports that he drinks about 21 ounces of alcohol per week. He reports that he does not use illicit drugs.   ROS:  Please see the history of present illness.    All other systems reviewed and negative.   PHYSICAL EXAM: VS:  BP 194/88  Pulse 67  Ht 5\' 6"  (1.676 m)   Wt 154 lb 6.4 oz (70.035 kg)  BMI 24.92 kg/m2 Well nourished, well developed, in no acute distress HEENT: normal Neck: no JVD; left CEA scar without erythema or d/c Cardiac:  normal S1, S2; RRR; no murmur Lungs:  clear to auscultation bilaterally, no wheezing, rhonchi or rales Abd: soft, nontender, no hepatomegaly Ext: no edema; right wrist without hematoma or bruit  Skin: warm and dry Neuro:  CNs 2-12 intact, no focal abnormalities noted  EKG:  NSR, HR 67, normal axis, LVH      ASSESSMENT AND PLAN:  1. Coronary Artery Disease:   As noted, he will be treated medically. He remains on aspirin and statin therapy.  He is not having any symptoms of angina. Consideration could be given towards PCI of his RCA should he develop anginal chest pain.  2. Hypertension:   Uncontrolled. Adjust carvedilol to 18.75 mg twice daily. Increase Norvasc to 10 mg daily. Of note, he takes his Diovan and his Norvasc at bedtime. I have asked him to switch this to the morning.  3. Cardiomyopathy:   Question ischemic versus mixed ischemic and nonischemic in the setting of uncontrolled hypertension. Ejection fraction was much better at cardiac catheterization than at [facility] or echocardiogram. Adjust carvedilol as noted. Continue ARB. Consider the addition of spironolactone if his blood pressure remains difficult to control. Also, consider hydralazine, nitrates. Volume appears stable.  4. Hyperlipidemia:   Continue Lipitor 40 mg daily. He will need lipids and LFTs checked in about 6-8 weeks.  5. Tobacco Abuse:  We discussed the importance of discontinuing cigarettes.  6. Lung Nodules:   Arrange followup chest CT in 12/2012.  7. Claudication:   He notes left leg pain with exertion. We will arrange ABIs.  8. Carotid Stenosis:   Doing well status post left CEA. He sees vascular surgery back next week.  9. Disposition:   Followup with me in 2-3 weeks and Dr. Shirlee Latch in 6-8 weeks.   SignedTereso Newcomer, PA-C  11:48  AM 06/26/2012

## 2012-06-26 NOTE — Patient Instructions (Addendum)
INCREASE COREG TO 18.75 MG THREE TIMES DAILY (6.25 MG TABLET TAKE 3 TABS 3 TIMES DAILY); RX SENT IN TODAY  INCREASE AMLODIPINE TO 10 MG DAILY; RX SENT IN TODAY  CHEST CT W/O CONTRAST TO BE DONE IN 6 MONTHS TO FOLLOW UP ON LUNG NODULES  LOWER EXTREMITY ARTERIAL DUPLEX WITH ABI'S DX CLAUDICATION  PLEASE FOLLOW UP WITH SCOTT WEAVER, PAC 2-3 WEEKS  FOLLOW UP WITH DR. Shirlee Latch IN 6-8 WEEKS

## 2012-06-27 ENCOUNTER — Telehealth: Payer: Self-pay | Admitting: Physician Assistant

## 2012-06-27 DIAGNOSIS — I10 Essential (primary) hypertension: Secondary | ICD-10-CM

## 2012-06-27 DIAGNOSIS — I2589 Other forms of chronic ischemic heart disease: Secondary | ICD-10-CM

## 2012-06-27 MED ORDER — CARVEDILOL 6.25 MG PO TABS: 18.7500 mg | ORAL_TABLET | Freq: Two times a day (BID) | ORAL | Status: DC

## 2012-06-27 NOTE — Telephone Encounter (Signed)
rx re-sent in today

## 2012-06-27 NOTE — Telephone Encounter (Signed)
Pt was in yesterday and was to have carvedilol 6.25 increased to 18.75, but it was not called in,  uses [facility] [place], pls call [phone] when done

## 2012-06-28 ENCOUNTER — Telehealth: Payer: Self-pay | Admitting: Cardiology

## 2012-06-28 NOTE — Telephone Encounter (Signed)
Spoke with pt's wife. DC instructions from 06/26/12 state  coreg 18.75mg  three times a day. The new prescription is for coreg 6.25mg  three tablets (total 18.75 mg) two times a day. I reviewed with Dr Shirlee Latch. The new prescription for coreg 6.25mg  three tablets (total 18.75mg ) bid is correct. Pt's wife advised,verblaized understanding.

## 2012-06-28 NOTE — Telephone Encounter (Signed)
New Problem:    Patient's wife called in because the prescription called in for her husbands carvedilol (COREG) 6.25 MG tablet was called in for the old dose and needs a new prescription sent in with the increased dose of 18.75 because he is going to run out before his insurance co will refill it.  Please call back.

## 2012-06-30 ENCOUNTER — Encounter: Payer: Self-pay | Admitting: Surgery

## 2012-07-03 ENCOUNTER — Encounter: Payer: Self-pay | Admitting: Surgery

## 2012-07-03 ENCOUNTER — Ambulatory Visit (INDEPENDENT_AMBULATORY_CARE_PROVIDER_SITE_OTHER): Payer: Managed Care, Other (non HMO) | Admitting: Surgery

## 2012-07-03 VITALS — BP 173/87 | HR 77 | Ht 66.0 in | Wt 156.3 lb

## 2012-07-03 DIAGNOSIS — R911 Solitary pulmonary nodule: Secondary | ICD-10-CM

## 2012-07-03 DIAGNOSIS — I6529 Occlusion and stenosis of unspecified carotid artery: Secondary | ICD-10-CM

## 2012-07-03 NOTE — Progress Notes (Signed)
The patient is back for his first postoperative followup. He underwent left carotid endarterectomy on 06/22/2012 for a symptomatic stenosis. He had had a left brain stroke prior to his operation. A CT scan revealed high-grade bilateral carotid stenosis. This was done at an outside institution. His postoperative course was uncomplicated. Intraoperative findings included a 95% stenosis. He has no complaints today other than some numbness around his incision.  On fistula examination his incision is healing nicely. He is neurologically intact.  I cannot do the CT scan that I ordered because it was done outside of the common system because of his insurance agency request. The report states that there is no right carotid stenosis. However, on my review as well as with radiologist from [place], we feel that there is at least 70% stenosis in the right carotid artery.  Have scheduled the patient to come back and see me in 6 months. He will undergo repeat carotid ultrasound imaging. I am also ordering a noncontrasted CT scan of the chest to followup lung nodules, as requested by radiology.

## 2012-07-06 NOTE — Addendum Note (Signed)
Addended by: Sharee Pimple on: 07/06/2012 09:33 AM   Modules accepted: Orders

## 2012-07-12 DIAGNOSIS — M1990 Unspecified osteoarthritis, unspecified site: Secondary | ICD-10-CM

## 2012-07-12 HISTORY — DX: Unspecified osteoarthritis, unspecified site: M19.90

## 2012-07-19 ENCOUNTER — Ambulatory Visit: Payer: Managed Care, Other (non HMO) | Admitting: Physician Assistant

## 2012-07-24 ENCOUNTER — Encounter (INDEPENDENT_AMBULATORY_CARE_PROVIDER_SITE_OTHER): Payer: Managed Care, Other (non HMO)

## 2012-07-24 ENCOUNTER — Telehealth: Payer: Self-pay | Admitting: *Deleted

## 2012-07-24 ENCOUNTER — Ambulatory Visit (INDEPENDENT_AMBULATORY_CARE_PROVIDER_SITE_OTHER): Payer: Managed Care, Other (non HMO) | Admitting: Physician Assistant

## 2012-07-24 ENCOUNTER — Encounter: Payer: Self-pay | Admitting: *Deleted

## 2012-07-24 ENCOUNTER — Encounter: Payer: Self-pay | Admitting: Physician Assistant

## 2012-07-24 VITALS — BP 172/80 | HR 62 | Ht 66.0 in | Wt 158.8 lb

## 2012-07-24 DIAGNOSIS — E785 Hyperlipidemia, unspecified: Secondary | ICD-10-CM

## 2012-07-24 DIAGNOSIS — I70219 Atherosclerosis of native arteries of extremities with intermittent claudication, unspecified extremity: Secondary | ICD-10-CM

## 2012-07-24 DIAGNOSIS — I739 Peripheral vascular disease, unspecified: Secondary | ICD-10-CM

## 2012-07-24 DIAGNOSIS — I2510 Atherosclerotic heart disease of native coronary artery without angina pectoris: Secondary | ICD-10-CM

## 2012-07-24 DIAGNOSIS — I429 Cardiomyopathy, unspecified: Secondary | ICD-10-CM

## 2012-07-24 DIAGNOSIS — I428 Other cardiomyopathies: Secondary | ICD-10-CM

## 2012-07-24 DIAGNOSIS — I10 Essential (primary) hypertension: Secondary | ICD-10-CM

## 2012-07-24 DIAGNOSIS — I6529 Occlusion and stenosis of unspecified carotid artery: Secondary | ICD-10-CM

## 2012-07-24 LAB — BASIC METABOLIC PANEL
BUN: 9 mg/dL (ref 6–23)
Chloride: 101 mEq/L (ref 96–112)
Creatinine, Ser: 0.8 mg/dL (ref 0.4–1.5)
GFR: 99.09 mL/min (ref >60.00)
Glucose, Bld: 107 mg/dL — ABNORMAL HIGH (ref 70–99)
Potassium: 4 mEq/L (ref 3.5–5.1)

## 2012-07-24 MED ORDER — VALSARTAN 320 MG PO TABS: 320.0000 mg | ORAL_TABLET | Freq: Every day | ORAL | Status: DC

## 2012-07-24 MED ORDER — SPIRONOLACTONE 25 MG PO TABS: 25.0000 mg | ORAL_TABLET | Freq: Every day | ORAL | Status: DC

## 2012-07-24 NOTE — Progress Notes (Signed)
[address], [address] [place], [place]  [address] Phone: [phone], Fax:  [phone]  Date:  07/24/2012   Name:  Darrell Willis   DOB:  [birth year]   MRN:  [id number]  PCP:  Renae Fickle, MD  Primary Cardiologist:  Dr. Marca Ancona  Primary Electrophysiologist:  None    History of Present Illness: Darrell Willis is a 67 y.o. male who returns for follow up on BP.  Patient had a recent CVA and saw Dr. Shirlee Latch for cardiology evaluation prior to left CEA. Patient was admitted to [facility] in 11/13 with RUE weakness. 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 Preoperative Lexiscan myoview demonstrated EF 35% but no evidence for ischemia or infarction. Echo was then done, showing EF 40% with apical hypokinesis. Cardiac catheterization was recommended.   LHC 06/16/12: dLM 20%, pLAD 30%, mLAD 70%, oCFX 40%, pOM2 50-60%, pOM3 40%, pRCA 90%, EF 50% with basal to mid inferior HK. Patient was not felt to have significant symptoms related to this high-grade RCA disease. He also did not have critical LAD stenosis. Intervention of the proximal RCA could be considered if he develops symptoms. Aggressive medical management was recommended. Patient then went for left CEA with Dr. Myra Gianotti 06/22/12.  Of note, recent chest CT demonstrates 2 small right lung nodules.    I saw him in followup 06/26/12. He was not having any symptoms of angina at that time. I adjusted his carvedilol and Norvasc for better blood pressure control. He was brought back today for followup on his blood pressure.  Overall doing well.  The patient denies chest pain, shortness of breath, syncope, orthopnea, PND or significant pedal edema.   Still notes left calf claudication that quickly resolves with rest.  Labs (11/13): Na 124, K 4.6, creatinine 0.81  Labs (12/13): Na 129, K 3.9, creatinine 0.78, ALT 28, Hgb 10.5  Wt Readings from Last 3 Encounters:  07/03/12 156 lb 4.8 oz (70.897 kg)  06/26/12  154 lb 6.4 oz (70.035 kg)  06/22/12 164 lb 10.9 oz (74.7 kg)     Past Medical History  Diagnosis Date  . HTN (hypertension)   . HLD (hyperlipidemia)   . Stroke 05/29/2012    11/13 admission to [facility] with right arm weakness and was found to have acute CVA. L MCA infarct was found. Carotid dopplers showed > 70% bilateral ICA stenosis. CTA neck showed severe left ICA stenosis. S/p L CEA 06/2012 ([facility])  . Tobacco abuse     1 PPD  . Cardiomyopathy     Lexiscan myoview (12/13) with EF 35%, global hypokinesis, no scar or ischemia. Echo (12/13) with EF 40-45%, mild LVH, apical hypokinesis.  [name] CAD (coronary artery disease)     LHC 06/16/12: dLM 20%, pLAD 30%, mLAD 70%, oCFX 40%, pOM2 50-60%, pOM3 40%, pRCA 90%, EF 50% with basal to mid inferior HK. => Med Rx unless angina -> consider PCI of RCA  . Lung nodule     Chest CT 06/22/12:  2 small R lung nodules => needs follow up CT 12/2012  . PAD (peripheral artery disease)     a. ABIs 07/24/12:  R 1.1, L 0.51, flush occlusion of L SFA with reconstitution above the knee;  bilat great toe pressures abnormal at risk for tissue loss on left    Current Outpatient Prescriptions  Medication Sig Dispense Refill  . amLODipine (NORVASC) 10 MG tablet Take 1 tablet (10 mg total) by mouth at bedtime.  30 tablet  11  . aspirin EC 81 MG tablet Take 81 mg by mouth at bedtime.       [name] atorvastatin (LIPITOR) 40 MG tablet Take 40 mg by mouth at bedtime.      . carvedilol (COREG) 6.25 MG tablet Take 3 tablets (18.75 mg total) by mouth 2 (two) times daily with a meal.  180 tablet  11  . clopidogrel (PLAVIX) 75 MG tablet Take 75 mg by mouth at bedtime.       . valsartan-hydrochlorothiazide (DIOVAN-HCT) 320-12.5 MG per tablet Take 1 tablet by mouth daily.        Allergies: Allergies  Allergen Reactions  . Penicillins     Social History:  The patient  reports that he has been smoking Cigarettes.  He has a 45 pack-year smoking history. He has never used  smokeless tobacco. He reports that he drinks about 21 ounces of alcohol per week. He reports that he does not use illicit drugs.   ROS:  Please see the history of present illness.    All other systems reviewed and negative.   PHYSICAL EXAM: VS:  BP 172/80  Pulse 62  Ht 5\' 6"  (1.676 m)  Wt 158 lb 12.8 oz (72.031 kg)  BMI 25.63 kg/m2 Well nourished, well developed, in no acute distress HEENT: normal Neck: no JVD; left CEA scar without erythema or d/c Cardiac:  normal S1, S2; RRR; no murmur Lungs:  clear to auscultation bilaterally, no wheezing, rhonchi or rales Abd: soft, nontender, no hepatomegaly Ext: no edema; right wrist without hematoma or bruit  Skin: warm and dry Neuro:  CNs 2-12 intact, no focal abnormalities noted  EKG:  NSR, HR 63, normal axis, LVH     ASSESSMENT AND PLAN:  1. Coronary Artery Disease:   No angina.  Medical Rx.  He remains on ASA and statin therapy.   Consideration could be given towards PCI of his RCA should he develop anginal chest pain.  2. Hypertension:   Patient's BP is better.  He only takes Diovan 320 mg QD (not HCTZ).  Will add Spironolactone 25 mg QD.  Check BMET today, 5 days after starting and 12 days after starting to monitor K+ and renal function.    3. Cardiomyopathy:   Question ischemic versus mixed ischemic and nonischemic in the setting of uncontrolled hypertension. Ejection fraction was much better at cardiac catheterization than at [facility] or echocardiogram. Continue beta blocker and ARB.  Add Spironolactone as noted.  4. Hyperlipidemia:   Continue Lipitor 40 mg daily. Will make sure he has follow up lipids and LFTs arranged.  5. Lung Nodules:   Arrange followup chest CT in 12/2012.  6. Peripheral Arterial Disease:   ABIs performed today.  Findings in left leg c/w his symptoms of claudication.  Refer to PV.  7. Carotid Stenosis:   He is s/p left CEA.  Patient ready to go back to work.  Ok to return from cardiac standpoint.  Will give him a  letter to support this.  However, I have asked him to contact Dr. Estanislado Sp[facility] office for a separate letter of clearance from vascular surgery standpoint.    8. Disposition:   Followup with Dr. Shirlee Latch as planned.   Signed, Tereso Newcomer, PA-C  1:55 PM 07/24/2012

## 2012-07-24 NOTE — Telephone Encounter (Signed)
Message copied by Tarri Fuller on Mon Jul 24, 2012  5:34 PM ------      Message from: [place], [place] T      Created: Mon Jul 24, 2012  5:12 PM       Potassium and kidney function look good.      Continue with current treatment plan.      Tereso Newcomer, PA-C  4:49 PM 05/25/2012

## 2012-07-24 NOTE — Patient Instructions (Addendum)
You have been referred to TO PV DOCTOR; EITHER DR. MCALHANY, [place], OR [facility]; DX PAD  START SPIRONOLACTONE 25 MG DAILY  STAY ON VALSARTAN 320 MG DAILY  LAB TODAY BMET  YOU HAVE BEEN GIVEN AN RX FOR LAB TO BE DONE BY PCP 07/2012 WITH RESULTS TO BE FAXED TO SCOTT WEAVER, PAC [phone]  KEEP FOLLOW UP APPT WITH DR. Shirlee Latch 08/07/12 THAT SAME DAY YOU WILL HAVE LAB WORK AGAIN TO RECHECK YOUR KIDNEY FUNCTION  YOU HAVE BEEN GIVEN A WORK NOTE CLEARED FROM CARDIAC STANDPOINT BUT YOU WILL STILL NEED CLEARANCE FROM VASCULAR SURGEON

## 2012-07-24 NOTE — Telephone Encounter (Signed)
pt's wife notifed about lab results w/verbal understanding today

## 2012-07-26 ENCOUNTER — Encounter: Payer: Self-pay | Admitting: *Deleted

## 2012-07-31 ENCOUNTER — Encounter: Payer: Self-pay | Admitting: Physician Assistant

## 2012-08-01 ENCOUNTER — Telehealth (INDEPENDENT_AMBULATORY_CARE_PROVIDER_SITE_OTHER): Payer: Self-pay | Admitting: *Deleted

## 2012-08-01 ENCOUNTER — Telehealth: Payer: Self-pay | Admitting: *Deleted

## 2012-08-01 ENCOUNTER — Encounter: Payer: Self-pay | Admitting: Physician Assistant

## 2012-08-01 DIAGNOSIS — I2589 Other forms of chronic ischemic heart disease: Secondary | ICD-10-CM

## 2012-08-01 DIAGNOSIS — I10 Essential (primary) hypertension: Secondary | ICD-10-CM

## 2012-08-01 DIAGNOSIS — R0989 Other specified symptoms and signs involving the circulatory and respiratory systems: Secondary | ICD-10-CM

## 2012-08-01 MED ORDER — CARVEDILOL 25 MG PO TABS: 25.0000 mg | ORAL_TABLET | Freq: Two times a day (BID) | ORAL | Status: DC

## 2012-08-01 NOTE — Telephone Encounter (Signed)
Message copied by Tarri Fuller on Tue Aug 01, 2012  5:25 PM ------      Message from: [place], [place] T      Created: Tue Aug 01, 2012  4:51 PM       K 4 => 5.2      Creatinine 0.9 (stable)      LDL 54 (looks good)            K+ increased with spironolactone.      Stop Spironolactone.      Check BMET in 1 week.      Increase Coreg to 25 mg bid.      Get BP check and ECG in 1 week.  Can do with PCP and fax to me.      Tereso Newcomer, PA-C  4:51 PM 08/01/2012

## 2012-08-01 NOTE — Telephone Encounter (Signed)
started s/w pt's wife about lab results and med changes but she states she was not at home. I told her I will cb tomorrow and we will go over results and changes then, she said that will be fine.

## 2012-08-01 NOTE — Telephone Encounter (Signed)
I stated that we did not have the lab results yet from [facility], but I will call and see if they are in yet. wife also states pt having reflux from medications and wanted to know if there was something Lorin Picket W. PA could give pt. advised d/w pa

## 2012-08-01 NOTE — Telephone Encounter (Signed)
Pts wife is advised that we did receive pts lab results but that S. Alben Spittle, PA-C has not had the opportunity to review as he is in clinic at this time. She is advised that we will contact her with Scott's recommendations concerning results. Per her request, a copy of lab results has been mailed to pts address.

## 2012-08-01 NOTE — Telephone Encounter (Signed)
See Lab comments in chart. Tereso Newcomer, PA-C  4:58 PM 08/01/2012

## 2012-08-02 NOTE — Telephone Encounter (Signed)
pt's wife notified about lab results and med changes with verbal understanding , will have bmet, ekg and bp on [place] visit 08/07/12

## 2012-08-03 ENCOUNTER — Encounter: Payer: Self-pay | Admitting: Physician Assistant

## 2012-08-05 ENCOUNTER — Encounter: Payer: Self-pay | Admitting: Physician Assistant

## 2012-08-07 ENCOUNTER — Telehealth: Payer: Self-pay | Admitting: *Deleted

## 2012-08-07 ENCOUNTER — Ambulatory Visit: Payer: Managed Care, Other (non HMO) | Admitting: Cardiology

## 2012-08-07 NOTE — Telephone Encounter (Signed)
s/w pt's wife. I asked what was going on and she states pt had trouble breathing on coreg 25 bid, dizziness. wife states she cut pt down to 3 tabs of 6.25 = 18.75 bid, says bp still up though. I advised will d/w PA and cb w/recommendations

## 2012-08-08 ENCOUNTER — Ambulatory Visit (INDEPENDENT_AMBULATORY_CARE_PROVIDER_SITE_OTHER): Payer: Managed Care, Other (non HMO) | Admitting: Cardiology

## 2012-08-08 ENCOUNTER — Encounter: Payer: Self-pay | Admitting: Cardiology

## 2012-08-08 VITALS — BP 162/74 | HR 64 | Ht 66.0 in | Wt 160.0 lb

## 2012-08-08 DIAGNOSIS — I2510 Atherosclerotic heart disease of native coronary artery without angina pectoris: Secondary | ICD-10-CM

## 2012-08-08 DIAGNOSIS — I429 Cardiomyopathy, unspecified: Secondary | ICD-10-CM

## 2012-08-08 DIAGNOSIS — I639 Cerebral infarction, unspecified: Secondary | ICD-10-CM

## 2012-08-08 DIAGNOSIS — I428 Other cardiomyopathies: Secondary | ICD-10-CM

## 2012-08-08 DIAGNOSIS — I10 Essential (primary) hypertension: Secondary | ICD-10-CM

## 2012-08-08 DIAGNOSIS — F17200 Nicotine dependence, unspecified, uncomplicated: Secondary | ICD-10-CM

## 2012-08-08 DIAGNOSIS — I6350 Cerebral infarction due to unspecified occlusion or stenosis of unspecified cerebral artery: Secondary | ICD-10-CM

## 2012-08-08 DIAGNOSIS — I2589 Other forms of chronic ischemic heart disease: Secondary | ICD-10-CM

## 2012-08-08 DIAGNOSIS — I739 Peripheral vascular disease, unspecified: Secondary | ICD-10-CM

## 2012-08-08 LAB — BASIC METABOLIC PANEL
CO2: 27 mEq/L (ref 19–32)
Calcium: 8.9 mg/dL (ref 8.4–10.5)
Creatinine, Ser: 0.9 mg/dL (ref 0.4–1.5)
GFR: 86.89 mL/min (ref >60.00)
Glucose, Bld: 114 mg/dL — ABNORMAL HIGH (ref 70–99)
Sodium: 133 mEq/L — ABNORMAL LOW (ref 135–145)

## 2012-08-08 MED ORDER — CARVEDILOL 25 MG PO TABS: 25.0000 mg | ORAL_TABLET | Freq: Two times a day (BID) | ORAL | Status: DC

## 2012-08-08 NOTE — Patient Instructions (Addendum)
increase coreg(carvedilol) to 25mg  two times a day.   Your physician recommends that you have lab work today--BMET.   Your physician recommends that you schedule a follow-up appointment in: 1 month with Dr Shirlee Latch.

## 2012-08-08 NOTE — Telephone Encounter (Signed)
Patient saw Dr. Marca Ancona today [address] PA-C  5:32 PM 08/08/2012

## 2012-08-09 NOTE — Progress Notes (Signed)
Patient ID: Darrell Willis, male   DOB: [birth year], 67 y.o.   MRN: [id number] PCP: Dr. Samuel Germany  67 yo with history of recent CVA and L CEA presents for cardiology followup.  Patient was admitted to [facility] in 11/13 with RUE weakness.  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  CEA was planned for tomorrow.  He was sent for preoperative Lexiscan myoview showing EF 35% but no evidence for ischemia or infarction.  Echo was then done, showing EF 40% with apical hypokinesis. LHC prior to CEA showed 90% proximal RCA and 70% mid LAD.  He denied exertional dyspnea or chest pain so intervention was deferred.  He underwent CEA without complication.   Darrell Willis continues to deny chest pain.  He has mild DOE walking up hills, otherwise he tends to do ok with exertion.  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  Main exertional complaint continues to be left calf pain.  He notes this with walking after about 100-200 feet.  If he rests briefly the pain resolves.  Peripheral arterial dopplers done recently were suggestive of left SFA occlusion.  BP continues to run high.  Spironolactone was discontinued due to elevated K.   Labs (11/13): Na 124, K 4.6, creatinine 0.81  Labs (1/14): K 5.2 (spironolactone stopped), creatinine 0.9, LDL 54, HDL 47  PMH: 1. HTN 2. Hyperlipidemia 3. CVA: 11/13 admission to [facility] with right arm weakness and was found to have acute CVA.  L MCA infarct was found.  Carotid dopplers showed > 70% bilateral ICA stenosis.  CTA neck showed severe left ICA stenosis.  Patient had left CEA in 12/13.  4. Active smoker, 1 ppd.  5. Cardiomyopathy: Probably ischemic.  Lexiscan myoview (12/13) with EF 35%, global hypokinesis, no scar or ischemia. Echo (12/13) with EF 40-45%, mild LVH, apical hypokinesis.  EF 50% on LV-gram in 12/13.  6. CAD: LHC (12/13) with EF 50%, basal inferior hypokinesis, 70% mLAD, 90% pRCA.  Patient was medically managed.  7.  PAD: ABIs (1/14) with left SFA occlusion, reconstitutes above the knee; toe PPG at risk for tissue loss on left.  8. CT chest 12/13 with 2 small nodules.   SH: Married, lives in [place], smokes 1 ppd, Designer, industrial/product.   FH: No known premature CAD.   ROS: All systems reviewed and negative except as per HPI.   Current Outpatient Prescriptions  Medication Sig Dispense Refill  . amLODipine (NORVASC) 10 MG tablet Take 1 tablet (10 mg total) by mouth at bedtime.  30 tablet  11  . aspirin EC 81 MG tablet Take 81 mg by mouth at bedtime.       [name] atorvastatin (LIPITOR) 40 MG tablet Take 40 mg by mouth at bedtime.      . clopidogrel (PLAVIX) 75 MG tablet Take 75 mg by mouth at bedtime.       . valsartan (DIOVAN) 320 MG tablet Take 1 tablet (320 mg total) by mouth daily.  30 tablet  11  . carvedilol (COREG) 25 MG tablet Take 1 tablet (25 mg total) by mouth 2 (two) times daily with a meal.  60 tablet  11   BP 162/74  Pulse 64  Ht 5\' 6"  (1.676 m)  Wt 160 lb (72.576 kg)  BMI 25.82 kg/m2  SpO2 98% General: NAD Neck: No JVD, no thyromegaly or thyroid nodule.  Lungs: Clear to auscultation with prolonged expiratory phase.  CV: Nondisplaced  PMI.  Heart regular S1/S2, no S3/S4, no murmur.  No peripheral edema.  Bilateral carotid bruits.  Absent left pedal pulses.  Abdomen: Soft, nontender, no hepatosplenomegaly, no distention.  Neurologic: Alert and oriented x 3.  Psych: Normal affect. Extremities: No clubbing or cyanosis.   Assessment/Plan: 1. Cardiomyopathy: EF in range of 40-45%, probably ischemic cardiomyopathy.  Not volume overloaded on exam.  Continue valsartan, will increase Coreg to 25 mg bid today.    2. CVA: Status post left CEA without complication.  RICA stenosis to be followed by [facility]. Continue Plavix, ASA.  3. Smoking: I again strongly encouraged the patient to quit smoking. When he is ready, I offered him pharmacological aide such as wellbutrin.  4. HTN: Still not controlled.   Increase Coreg to 25 mg bid, as above.  Will need renal artery dopplers for renal artery stenosis evaluation.  5. Claudication: Left SFA occlusion with toe PPGs on the left at risk for tissue loss.  He has been set up for peripheral vascular evaluation.  6. CAD: 90% pRCA, 70% mLAD on pre-CEA cath.  Given no chest pain or dyspnea, he was not intervened upon.  He continues to deny chest pain or significant dyspnea.  Will continue medical management for now with ASA, Plavix, Coreg, atorvastatin 40, and valsartan.  7. Hyperlipidemia: Lipids improved in 1/14, continue current dose of atorvastatin.  8. Lung nodules: Repeat chest CT w/o contrast in 6/14.   Darrell Willis 08/09/2012

## 2012-08-14 ENCOUNTER — Encounter: Payer: Self-pay | Admitting: Cardiology

## 2012-08-17 ENCOUNTER — Ambulatory Visit (INDEPENDENT_AMBULATORY_CARE_PROVIDER_SITE_OTHER): Payer: Managed Care, Other (non HMO) | Admitting: Cardiovascular Disease

## 2012-08-17 ENCOUNTER — Encounter: Payer: Self-pay | Admitting: *Deleted

## 2012-08-17 ENCOUNTER — Encounter: Payer: Self-pay | Admitting: Cardiovascular Disease

## 2012-08-17 ENCOUNTER — Encounter (HOSPITAL_COMMUNITY): Payer: Self-pay | Admitting: Pharmacy Technician

## 2012-08-17 VITALS — BP 157/67 | HR 60 | Ht 66.0 in | Wt 160.0 lb

## 2012-08-17 DIAGNOSIS — Z720 Tobacco use: Secondary | ICD-10-CM

## 2012-08-17 DIAGNOSIS — I739 Peripheral vascular disease, unspecified: Secondary | ICD-10-CM

## 2012-08-17 DIAGNOSIS — F17200 Nicotine dependence, unspecified, uncomplicated: Secondary | ICD-10-CM

## 2012-08-17 DIAGNOSIS — F1010 Alcohol abuse, uncomplicated: Secondary | ICD-10-CM

## 2012-08-17 LAB — CBC WITH DIFFERENTIAL/PLATELET
Basophils Absolute: 0.1 10*3/uL (ref 0.0–0.1)
Eosinophils Absolute: 0.4 10*3/uL (ref 0.0–0.7)
Hemoglobin: 11.1 g/dL — ABNORMAL LOW (ref 13.0–17.0)
Lymphocytes Relative: 22.7 % (ref 12.0–46.0)
MCHC: 34 g/dL (ref 30.0–36.0)
Monocytes Relative: 7.7 % (ref 3.0–12.0)
Neutrophils Relative %: 65 % (ref 43.0–77.0)
RDW: 13.3 % (ref 11.5–14.6)

## 2012-08-17 LAB — PROTIME-INR
INR: 0.9 ratio (ref 0.8–1.0)
Prothrombin Time: 10 s — ABNORMAL LOW (ref 10.2–12.4)

## 2012-08-17 LAB — BASIC METABOLIC PANEL
CO2: 26 mEq/L (ref 19–32)
Chloride: 97 mEq/L (ref 96–112)
Potassium: 4.5 mEq/L (ref 3.5–5.1)
Sodium: 129 mEq/L — ABNORMAL LOW (ref 135–145)

## 2012-08-17 NOTE — Progress Notes (Signed)
History of Present Illness: 67 yo male with history of CVA and L CEA, CAD, ischemic cardiomyopathy and new diagnosis of PAD who is here today for PV evaluation. His cardiac issues are followed by Dr. Shirlee Latch.  Patient was admitted to [facility] in 11/13 with RUE weakness. 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 CEA was planned for tomorrow. He was sent for preoperative Lexiscan myoview showing EF 35% but no evidence for ischemia or infarction. Echo was then done, showing EF 40% with apical hypokinesis. LHC prior to CEA showed 90% proximal RCA and 70% mid LAD. He denied exertional dyspnea or chest pain so intervention was deferred. He underwent CEA without complication. His CAD has been managed medically. His only complaint has been left calf pain with walking. Recent non-invasive imaging of the lower extremities with occlusion of left SFA at the ostium with reconstitution just above the knee, ABI 0.51 on the left. Right ABI is 1.1.   He tells me today that he has cramping in his calf muscle when he walks. He can walk 100-200 feet before he has the onset of leg pain. This resolves quickly with rest. No ulcerations. No rest pain. This has been present for at least a year but worsened over the last few months. He also notes right sided chest pain yesterday at rest with some dyspnea. His CAD is being medically managed for now.   Primary Care Physician: Renae Fickle  Past Medical History  Diagnosis Date  . HTN (hypertension)   . HLD (hyperlipidemia)   . Stroke 05/29/2012    11/13 admission to [facility] with right arm weakness and was found to have acute CVA. L MCA infarct was found. Carotid dopplers showed > 70% bilateral ICA stenosis. CTA neck showed severe left ICA stenosis. S/p L CEA 06/2012 ([facility])  . Tobacco abuse     1 PPD  . Cardiomyopathy     Lexiscan myoview (12/13) with EF 35%, global hypokinesis, no scar or ischemia. Echo (12/13) with EF 40-45%, mild  LVH, apical hypokinesis.  [name] CAD (coronary artery disease)     LHC 06/16/12: dLM 20%, pLAD 30%, mLAD 70%, oCFX 40%, pOM2 50-60%, pOM3 40%, pRCA 90%, EF 50% with basal to mid inferior HK. => Med Rx unless angina -> consider PCI of RCA  . Lung nodule     Chest CT 06/22/12:  2 small R lung nodules => needs follow up CT 12/2012  . PAD (peripheral artery disease)     a. ABIs 07/24/12:  R 1.1, L 0.51, flush occlusion of L SFA with reconstitution above the knee;  bilat great toe pressures abnormal at risk for tissue loss on left    Past Surgical History  Procedure Date  . Endarterectomy 06/22/2012    Procedure: ENDARTERECTOMY CAROTID;  Surgeon: Nada Libman, MD;  Location: [facility] OR;  Service: Vascular;  Laterality: Left;  . Patch angioplasty 06/22/2012    Procedure: PATCH ANGIOPLASTY;  Surgeon: Nada Libman, MD;  Location: [facility] OR;  Service: Vascular;  Laterality: Left;  Vascu-Guard Patch Angioplasty    Current Outpatient Prescriptions  Medication Sig Dispense Refill  . amLODipine (NORVASC) 10 MG tablet Take 1 tablet (10 mg total) by mouth at bedtime.  30 tablet  11  . aspirin EC 81 MG tablet Take 81 mg by mouth at bedtime.       [name] atorvastatin (LIPITOR) 40 MG tablet Take 40 mg by mouth at bedtime.      [name]  carvedilol (COREG) 25 MG tablet Take 1 tablet (25 mg total) by mouth 2 (two) times daily with a meal.  60 tablet  11  . clopidogrel (PLAVIX) 75 MG tablet Take 75 mg by mouth at bedtime.       . metaxalone (SKELAXIN) 800 MG tablet Take 800 mg by mouth 3 (three) times daily. AS NEEDED      . valsartan (DIOVAN) 320 MG tablet Take 1 tablet (320 mg total) by mouth daily.  30 tablet  11    Allergies  Allergen Reactions  . Penicillins     History   Social History  . Marital Status: Married    Spouse Name: N/A    Number of Children: 0  . Years of Education: N/A   Occupational History  . 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 Smoking status: Current Every Day Smoker  -- 1.0 packs/day for 45 years    Types: Cigarettes  . Smokeless tobacco: Never Used  . Alcohol Use: 21.0 oz/week    35 Cans of beer per week  . Drug Use: No  . Sexually Active: Not on file   Other Topics Concern  . Not on file   Social History Narrative  . No narrative on file    Family History  Problem Relation Age of Onset  . CAD Neg Hx   . Stroke Brother     Review of Systems:  As stated in the HPI and otherwise negative.   BP 157/67  Pulse 60  Ht 5\' 6"  (1.676 m)  Wt 160 lb (72.576 kg)  BMI 25.82 kg/m2  Physical Examination: General: Well developed, well nourished, NAD HEENT: OP clear, mucus membranes moist SKIN: warm, dry. No rashes. Neuro: No focal deficits Musculoskeletal: Muscle strength 5/5 all ext Psychiatric: Mood and affect normal Neck: No JVD, no carotid bruits, no thyromegaly, no lymphadenopathy. Lungs:Clear bilaterally, no wheezes, rhonci, crackles Cardiovascular: Regular rate and rhythm. No murmurs, gallops or rubs. Abdomen:Soft. Bowel sounds present. Non-tender.  Extremities: No lower extremity edema. Pulses are 2 + in the right DP/PT, non-palpable left DP/PT. No ulcerations. Feet are warm to touch.   Arterial Dopplers Lower Ext: 07/24/12:  Right ABI 1.1 Left ABI 0.51 Flush occlusion left SFA with reconstitution above left knee. Distal SFA, popliteal artery, tib/peroneal trunk widely patent.   Assessment and Plan:   1. PAD: He has classic claudication and evidence of left SFA occlusion on non-invasive imaging with ABI of 0.5 on the left. He continues to smoke. I have discussed PAD and possible treatment options. I will arrange a distal aortogram with possible PTA in the PV Lab with Dr. Kirke Corin on 2/19, 2014. Risks and benefits of procedure are reviewed with the patient and he agrees to proceed. Will get pre-procedure labs today.   2. Tobacco Abuse: He is asked to stop smoking but he has no real desire to stop.

## 2012-08-17 NOTE — Patient Instructions (Signed)
Your physician has requested that you have a peripheral vascular angiogram. This exam is performed at the hospital. During this exam IV contrast is used to look at arterial blood flow. Please review the information sheet given for details. Scheduled for August 30, 2012 with Dr. Kirke Corin.

## 2012-08-19 ENCOUNTER — Encounter: Payer: Self-pay | Admitting: Cardiology

## 2012-08-22 ENCOUNTER — Encounter: Payer: Self-pay | Admitting: Cardiology

## 2012-08-22 NOTE — Discharge Summary (Signed)
Agree with above  Darrell Willis 

## 2012-08-26 ENCOUNTER — Other Ambulatory Visit: Payer: Self-pay

## 2012-08-29 MED ORDER — SODIUM CHLORIDE 0.9 % IV SOLN: INTRAVENOUS | Status: DC

## 2012-08-30 ENCOUNTER — Ambulatory Visit (HOSPITAL_COMMUNITY)
Admission: RE | Admit: 2012-08-30 | Discharge: 2012-08-30 | Disposition: A | Discharge status: Home / Self Care | Payer: Managed Care, Other (non HMO) | Source: Ambulatory Visit | Attending: Cardiovascular Disease | Admitting: Cardiovascular Disease

## 2012-08-30 ENCOUNTER — Encounter (HOSPITAL_COMMUNITY)
Admission: RE | Disposition: A | Discharge status: Home / Self Care | Payer: Self-pay | Source: Ambulatory Visit | Attending: Cardiovascular Disease

## 2012-08-30 DIAGNOSIS — I70219 Atherosclerosis of native arteries of extremities with intermittent claudication, unspecified extremity: Secondary | ICD-10-CM

## 2012-08-30 DIAGNOSIS — I2510 Atherosclerotic heart disease of native coronary artery without angina pectoris: Secondary | ICD-10-CM | POA: Insufficient documentation

## 2012-08-30 DIAGNOSIS — I701 Atherosclerosis of renal artery: Secondary | ICD-10-CM | POA: Insufficient documentation

## 2012-08-30 DIAGNOSIS — E785 Hyperlipidemia, unspecified: Secondary | ICD-10-CM | POA: Insufficient documentation

## 2012-08-30 DIAGNOSIS — I6529 Occlusion and stenosis of unspecified carotid artery: Secondary | ICD-10-CM | POA: Insufficient documentation

## 2012-08-30 DIAGNOSIS — I708 Atherosclerosis of other arteries: Secondary | ICD-10-CM | POA: Insufficient documentation

## 2012-08-30 DIAGNOSIS — I739 Peripheral vascular disease, unspecified: Secondary | ICD-10-CM

## 2012-08-30 DIAGNOSIS — I10 Essential (primary) hypertension: Secondary | ICD-10-CM | POA: Insufficient documentation

## 2012-08-30 HISTORY — PX: ABDOMINAL ANGIOGRAM: SHX5499

## 2012-08-30 HISTORY — PX: LOWER EXTREMITY ANGIOGRAM: SHX5508

## 2012-08-30 LAB — CBC
HCT: 35.7 % — ABNORMAL LOW (ref 39.0–52.0)
Hemoglobin: 12.5 g/dL — ABNORMAL LOW (ref 13.0–17.0)
MCHC: 35 g/dL (ref 30.0–36.0)
MCV: 91.8 fL (ref 78.0–100.0)
RDW: 12.9 % (ref 11.5–15.5)
WBC: 8 10*3/uL (ref 4.0–10.5)

## 2012-08-30 LAB — PROTIME-INR: INR: 0.92 (ref 0.00–1.49)

## 2012-08-30 SURGERY — ANGIOGRAM, LOWER EXTREMITY
Anesthesia: LOCAL

## 2012-08-30 MED ORDER — SODIUM CHLORIDE 0.9 % IV SOLN: INTRAVENOUS | Status: DC

## 2012-08-30 MED ORDER — FENTANYL CITRATE 0.05 MG/ML IJ SOLN
INTRAMUSCULAR | Status: AC
  Filled 2012-08-30: qty 2

## 2012-08-30 MED ORDER — MIDAZOLAM HCL 2 MG/2ML IJ SOLN
INTRAMUSCULAR | Status: AC
  Filled 2012-08-30: qty 2

## 2012-08-30 MED ORDER — LIDOCAINE HCL (PF) 1 % IJ SOLN
INTRAMUSCULAR | Status: AC
  Filled 2012-08-30: qty 30

## 2012-08-30 MED ORDER — SODIUM CHLORIDE 0.9 % IV SOLN
INTRAVENOUS | Status: AC
  Administered 2012-08-30: 10:00:00 via INTRAVENOUS

## 2012-08-30 NOTE — Research (Signed)
[facility] Informed Consent   Subject Name: Darrell Willis  Subject met inclusion and exclusion criteria.  The informed consent form, study requirements and expectations were reviewed with the subject and questions and concerns were addressed prior to the signing of the consent form.  The subject verbalized understanding of the trial requirements.  The subject agreed to participate in the [facility] trial and signed the informed consent.  The informed consent was obtained prior to performance of any protocol-specific procedures for the subject.  A copy of the signed informed consent was given to the subject and a copy was placed in the subject's medical record.  Brunilda Payor 08/30/2012, 1:44 PM

## 2012-08-30 NOTE — H&P (View-Only) (Signed)
 History of Present Illness: 67 yo male with history of CVA and L CEA, CAD, ischemic cardiomyopathy and new diagnosis of PAD who is here today for PV evaluation. His cardiac issues are followed by Dr. McLean.  Patient was admitted to [facility] in 11/13 with RUE weakness. 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 CEA was planned for tomorrow. He was sent for preoperative Lexiscan myoview showing EF 35% but no evidence for ischemia or infarction. Echo was then done, showing EF 40% with apical hypokinesis. LHC prior to CEA showed 90% proximal RCA and 70% mid LAD. He denied exertional dyspnea or chest pain so intervention was deferred. He underwent CEA without complication. His CAD has been managed medically. His only complaint has been left calf pain with walking. Recent non-invasive imaging of the lower extremities with occlusion of left SFA at the ostium with reconstitution just above the knee, ABI 0.51 on the left. Right ABI is 1.1.   He tells me today that he has cramping in his calf muscle when he walks. He can walk 100-200 feet before he has the onset of leg pain. This resolves quickly with rest. No ulcerations. No rest pain. This has been present for at least a year but worsened over the last few months. He also notes right sided chest pain yesterday at rest with some dyspnea. His CAD is being medically managed for now.   Primary Care Physician: John Gage  Past Medical History  Diagnosis Date  . HTN (hypertension)   . HLD (hyperlipidemia)   . Stroke 05/29/2012    11/13 admission to  Hospital with right arm weakness and was found to have acute CVA. L MCA infarct was found. Carotid dopplers showed > 70% bilateral ICA stenosis. CTA neck showed severe left ICA stenosis. S/p L CEA 06/2012 (Brabham)  . Tobacco abuse     1 PPD  . Cardiomyopathy     Lexiscan myoview (12/13) with EF 35%, global hypokinesis, no scar or ischemia. Echo (12/13) with EF 40-45%, mild  LVH, apical hypokinesis.  . CAD (coronary artery disease)     LHC 06/16/12: dLM 20%, pLAD 30%, mLAD 70%, oCFX 40%, pOM2 50-60%, pOM3 40%, pRCA 90%, EF 50% with basal to mid inferior HK. => Med Rx unless angina -> consider PCI of RCA  . Lung nodule     Chest CT 06/22/12:  2 small R lung nodules => needs follow up CT 12/2012  . PAD (peripheral artery disease)     a. ABIs 07/24/12:  R 1.1, L 0.51, flush occlusion of L SFA with reconstitution above the knee;  bilat great toe pressures abnormal at risk for tissue loss on left    Past Surgical History  Procedure Date  . Endarterectomy 06/22/2012    Procedure: ENDARTERECTOMY CAROTID;  Surgeon: Vance W Brabham, MD;  Location: [facility] OR;  Service: Vascular;  Laterality: Left;  . Patch angioplasty 06/22/2012    Procedure: PATCH ANGIOPLASTY;  Surgeon: Vance W Brabham, MD;  Location: [facility] OR;  Service: Vascular;  Laterality: Left;  Vascu-Guard Patch Angioplasty    Current Outpatient Prescriptions  Medication Sig Dispense Refill  . amLODipine (NORVASC) 10 MG tablet Take 1 tablet (10 mg total) by mouth at bedtime.  30 tablet  11  . aspirin EC 81 MG tablet Take 81 mg by mouth at bedtime.       . atorvastatin (LIPITOR) 40 MG tablet Take 40 mg by mouth at bedtime.      .   carvedilol (COREG) 25 MG tablet Take 1 tablet (25 mg total) by mouth 2 (two) times daily with a meal.  60 tablet  11  . clopidogrel (PLAVIX) 75 MG tablet Take 75 mg by mouth at bedtime.       . metaxalone (SKELAXIN) 800 MG tablet Take 800 mg by mouth 3 (three) times daily. AS NEEDED      . valsartan (DIOVAN) 320 MG tablet Take 1 tablet (320 mg total) by mouth daily.  30 tablet  11    Allergies  Allergen Reactions  . Penicillins     History   Social History  . Marital Status: Married    Spouse Name: N/A    Number of Children: 0  . Years of Education: N/A   Occupational History  . 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 Smoking status: Current Every Day Smoker  -- 1.0 packs/day for 45 years    Types: Cigarettes  . Smokeless tobacco: Never Used  . Alcohol Use: 21.0 oz/week    35 Cans of beer per week  . Drug Use: No  . Sexually Active: Not on file   Other Topics Concern  . Not on file   Social History Narrative  . No narrative on file    Family History  Problem Relation Age of Onset  . CAD Neg Hx   . Stroke Brother     Review of Systems:  As stated in the HPI and otherwise negative.   BP 157/67  Pulse 60  Ht 5' 6" (1.676 m)  Wt 160 lb (72.576 kg)  BMI 25.82 kg/m2  Physical Examination: General: Well developed, well nourished, NAD HEENT: OP clear, mucus membranes moist SKIN: warm, dry. No rashes. Neuro: No focal deficits Musculoskeletal: Muscle strength 5/5 all ext Psychiatric: Mood and affect normal Neck: No JVD, no carotid bruits, no thyromegaly, no lymphadenopathy. Lungs:Clear bilaterally, no wheezes, rhonci, crackles Cardiovascular: Regular rate and rhythm. No murmurs, gallops or rubs. Abdomen:Soft. Bowel sounds present. Non-tender.  Extremities: No lower extremity edema. Pulses are 2 + in the right DP/PT, non-palpable left DP/PT. No ulcerations. Feet are warm to touch.   Arterial Dopplers Lower Ext: 07/24/12:  Right ABI 1.1 Left ABI 0.51 Flush occlusion left SFA with reconstitution above left knee. Distal SFA, popliteal artery, tib/peroneal trunk widely patent.   Assessment and Plan:   1. PAD: He has classic claudication and evidence of left SFA occlusion on non-invasive imaging with ABI of 0.5 on the left. He continues to smoke. I have discussed PAD and possible treatment options. I will arrange a distal aortogram with possible PTA in the PV Lab with Dr. Arida on 2/19, 2014. Risks and benefits of procedure are reviewed with the patient and he agrees to proceed. Will get pre-procedure labs today.   2. Tobacco Abuse: He is asked to stop smoking but he has no real desire to stop.  

## 2012-08-30 NOTE — CV Procedure (Signed)
PERIPHERAL VASCULAR PROCEDURE  NAME:  Darrell Willis   MRN: [id number] DOB:  1946-06-27   ADMIT DATE: 08/30/2012  Performing Cardiologist: [name] Primary Physician: Renae Fickle, MD and Primary Cardiologist:  CM   Procedures Performed:  Abdominal Aortic Angiogram with Bi-Iliofemoral Runoff  Bilateral Lower Extremity Angiography    Indication(s):   Claudication    Consent: The procedure with Risks/Benefits/Alternatives and Indications was reviewed with the patient  and family).  All questions were answered.  Medications:  Sedation:  1 mg IV Versed, 25 mcg IV Fentanyl  Contrast:  138 ML of Visipaque   Procedural details: The right groin was prepped, draped, and anesthetized with 1% lidocaine. Using modified Seldinger technique, a 5 French sheath was introduced into the right common femoral artery. A 5 Fr Short Pigtail Catheter was advanced of over a  Versicore wire into the descending Aorta to a level just above the renal arteries. A power injection of 75ml/sec contrast over 1 sec was performed for Abdominal Aortic Angiography.  The catheter was then pulled back to a level just above the Aortic bifurcation, and a second power injection was performed to evaluate bi-ileiofemoral arteries with runnoff.  The catheter was then removed. Femoral angiography showed good placement of the sheath. The sheath was removed and the site was closed with a Mynx closure device.  Hemodynamics:  Central Aortic Pressure / Mean Aortic Pressure: 156/81  Findings:  Abdominal aorta: Normal in size with mild diffuse atherosclerosis with no evidence of aneurysm or obstructive disease.  Left renal artery: There are 2 left renal arteries. The upper artery is occluded at the ostium. The lower renal artery has a 95% proximal stenosis  Right renal artery: There are 2 renal arteries on the right side which are very close in a region. The upper artery has 80% ostial stenosis. The lower artery has at  least 60% ostial stenosis.  Celiac artery: Not visualized.  Superior mesenteric artery: Patent  Right common iliac artery: Calcified with diffuse 20% disease.  Right internal iliac artery: Occluded at the ostium.  Right external iliac artery: Diffuse 20% disease.  Right common femoral artery: Diffuse 10% disease.  Right profunda femoral artery: Normal.  Right superficial femoral artery: Calcified throughout its course with 50% proximal stenosis and 50% mid stenosis. The distal segment has minor irregularities.  Right popliteal artery: Mild diffuse atherosclerosis with 30% disease.  Right tibial peroneal trunk: Diffuse 30% disease.  Right anterior tibial artery: Occluded proximally and reconstitutes distally via collaterals from the peroneal.  Right peroneal artery: Patent without significant disease.  Right posterior tibial artery: Patent without significant disease.  Left common iliac artery:  Calcified with 20% stenosis.  Left internal iliac artery: Calcified and diffusely diseased with 50% proximal stenosis.  Left external iliac artery: Minor irregularities.  Left common femoral artery: Normal  Left profunda femoral artery: Large in size with mild diffuse atherosclerosis. This supplies collaterals to the distal SFA.  Left superficial femoral artery:  Occluded at the ostium and reconstitutes in the distal segment via collaterals from the profunda.  Left popliteal artery: Diffuse irregularities.  Left tibial peroneal trunk: Patent with no significant disease.  Left anterior tibial artery: Originates high from the popliteal artery with minor irregularities.  Left peroneal artery: Minor irregularities.  Left posterior tibial artery: Minor irregularities.  Conclusions: 1. Significant bilateral renal artery stenosis. There are 2 renal arteries on each side. The left upper renal artery is occluded and the lower 1 has a 95%  stenosis. There is at least 60-70% stenosis in  the ostial right renal arteries. 2. Occluded right internal iliac artery. 3. Mild to moderate right SFA disease with two-vessel runoff on the right side below the knee. 4. Flush occlusion of the left SFA with reconstitution distally via collaterals from the profunda. Three-vessel runoff on the left side.  Recommendations:  Continue aggressive medical therapy. I recommend an initial walking program. The left SFA would be very difficult to approach by endovascular means with poor long-term patency. Consider renal artery stenting if there is a clinical indication. However, the renal arteries overall are smaller in diameter than the average.   [name], MD, [facility] 08/30/2012 2:00 PM

## 2012-08-30 NOTE — Interval H&P Note (Signed)
History and Physical Interval Note:  08/30/2012 1:21 PM  Darrell Willis  has presented today for surgery, with the diagnosis of Claudication  The various methods of treatment have been discussed with the patient and family. After consideration of risks, benefits and other options for treatment, the patient has consented to  Procedure(s): LOWER EXTREMITY ANGIOGRAM (N/A) as a surgical intervention .  The patient's history has been reviewed, patient examined, no change in status, stable for surgery.  I have reviewed the patient's chart and labs.  Questions were answered to the patient's satisfaction.     [name]

## 2012-08-31 ENCOUNTER — Encounter: Payer: Self-pay | Admitting: Cardiology

## 2012-08-31 ENCOUNTER — Telehealth: Payer: Self-pay | Admitting: Cardiology

## 2012-08-31 NOTE — Telephone Encounter (Signed)
Wife calls to find out when her husband (pt) can return to work. He had an AA angiogram & bifemoral runoff yesterday at [facility] with Dr. Kirke Corin. Discharge instructions did not include when pt could return to work.  He is a Pharmacist, hospital.  Will forward to Dr. Shirlee Latch and return call to pt wife. Mylo Red RN

## 2012-08-31 NOTE — Telephone Encounter (Signed)
LMOVM with wife reiterating that pt does not have CKD. We had discussed earlier his renal artery stenosis from this weeks angiogram.               Also left message that pt can return to work tomorrow.               Needs to call office to make appt in a few weeks with Dr. Kirke Corin discuss renal artery stent. Mylo Red RN

## 2012-08-31 NOTE — Telephone Encounter (Signed)
New Problem    Pt wife has some questions regarding chronic kidney disease diagnosis per Dr. Kirke Corin.

## 2012-08-31 NOTE — Telephone Encounter (Signed)
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  He does not have chronic kidney disease. He was found to have renal artery stenosis. Have him follow up with me in few weeks to decide if we need to stent the renal artery.

## 2012-08-31 NOTE — Telephone Encounter (Signed)
Pt wants to know can he rtn to work tomorrow

## 2012-08-31 NOTE — Telephone Encounter (Signed)
Wife received message from Dr. Shirlee Latch that pt could return to work on 09/01/12 if okay with Dr. Kirke Corin Also, would like about the chronic kidney disease. Mylo Red RN

## 2012-09-11 ENCOUNTER — Ambulatory Visit: Payer: Managed Care, Other (non HMO) | Admitting: Cardiology

## 2012-09-11 ENCOUNTER — Encounter: Payer: Self-pay | Admitting: Cardiology

## 2012-09-11 NOTE — Telephone Encounter (Signed)
I will forward this to scheduling.

## 2012-09-13 ENCOUNTER — Ambulatory Visit (INDEPENDENT_AMBULATORY_CARE_PROVIDER_SITE_OTHER): Payer: Managed Care, Other (non HMO) | Admitting: Cardiovascular Disease

## 2012-09-13 ENCOUNTER — Encounter: Payer: Self-pay | Admitting: Cardiovascular Disease

## 2012-09-13 VITALS — BP 138/82 | HR 65 | Ht 66.0 in | Wt 159.0 lb

## 2012-09-13 DIAGNOSIS — I701 Atherosclerosis of renal artery: Secondary | ICD-10-CM | POA: Insufficient documentation

## 2012-09-13 NOTE — Patient Instructions (Addendum)
Your physician recommends that you continue on your current medications as directed. Please refer to the Current Medication list given to you today.  Call us when you decide to have the renal angiogram with stenting.

## 2012-09-14 ENCOUNTER — Encounter: Payer: Self-pay | Admitting: Cardiovascular Disease

## 2012-09-14 NOTE — Progress Notes (Signed)
History of Present Illness: 67 yo male with history of CVA and L CEA, CAD, ischemic cardiomyopathy and new diagnosis of PAD who is here today for a followup visit after recent angiography . His cardiac issues are followed by Dr. Shirlee Latch.  Patient was admitted to [facility] in 11/13 with RUE weakness. 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  He was sent for preoperative Lexiscan myoview showing EF 35% but no evidence for ischemia or infarction. Echo was then done, showing EF 40% with apical hypokinesis. LHC prior to CEA showed 90% proximal RCA and 70% mid LAD. He denied exertional dyspnea or chest pain so intervention was deferred. He underwent CEA without complication. His CAD has been managed medically. He complained of left calf claudication. Non-invasive imaging of the lower extremities with occlusion of left SFA at the ostium with reconstitution just above the knee, ABI 0.51 on the left. Right ABI is 1.1.  He was seen by CM who referred him for angiography which showed no significant aortoiliac disease. Left SFA was occluded at the ostium and reconstituted distally via collaterals from the profunda. He was noted to have dual renal arteries bilaterally. On the left side the upper branch was occluded supplying at least 50% of the left kidney. The lower branch had a 90% ostial stenosis. The right renal artery appeared to have at least 50-60% ostial stenosis.   Past Medical History  Diagnosis Date  . HTN (hypertension)   . HLD (hyperlipidemia)   . Stroke 05/29/2012    11/13 admission to [facility] with right arm weakness and was found to have acute CVA. L MCA infarct was found. Carotid dopplers showed > 70% bilateral ICA stenosis. CTA neck showed severe left ICA stenosis. S/p L CEA 06/2012 (Brabham)  . Tobacco abuse     1 PPD  . Cardiomyopathy     Lexiscan myoview (12/13) with EF 35%, global hypokinesis, no scar or ischemia. Echo (12/13) with EF 40-45%, mild  LVH, apical hypokinesis.  [name] CAD (coronary artery disease)     LHC 06/16/12: dLM 20%, pLAD 30%, mLAD 70%, oCFX 40%, pOM2 50-60%, pOM3 40%, pRCA 90%, EF 50% with basal to mid inferior HK. => Med Rx unless angina -> consider PCI of RCA  . Lung nodule     Chest CT 06/22/12:  2 small R lung nodules => needs follow up CT 12/2012  . PAD (peripheral artery disease)     a. ABIs 07/24/12:  R 1.1, L 0.51, flush occlusion of L SFA with reconstitution above the knee;  bilat great toe pressures abnormal at risk for tissue loss on left  . Renal artery stenosis     Past Surgical History  Procedure Laterality Date  . Endarterectomy  06/22/2012    Procedure: ENDARTERECTOMY CAROTID;  Surgeon: Nada Libman, MD;  Location: [facility] OR;  Service: Vascular;  Laterality: Left;  . Patch angioplasty  06/22/2012    Procedure: PATCH ANGIOPLASTY;  Surgeon: Nada Libman, MD;  Location: [facility] OR;  Service: Vascular;  Laterality: Left;  Vascu-Guard Patch Angioplasty    Current Outpatient Prescriptions  Medication Sig Dispense Refill  . amLODipine (NORVASC) 10 MG tablet Take 1 tablet (10 mg total) by mouth at bedtime.  30 tablet  11  . aspirin EC 81 MG tablet Take 81 mg by mouth at bedtime.       [name] atorvastatin (LIPITOR) 40 MG tablet Take 40 mg by mouth at bedtime.      [name]  carvedilol (COREG) 25 MG tablet Take 6.25 mg by mouth 2 (two) times daily with a meal.      . clopidogrel (PLAVIX) 75 MG tablet Take 75 mg by mouth at bedtime.       . cyclobenzaprine (FLEXERIL) 10 MG tablet Take 10 mg by mouth 3 (three) times daily as needed for muscle spasms.      . valsartan (DIOVAN) 320 MG tablet Take 1 tablet (320 mg total) by mouth daily.  30 tablet  11   No current facility-administered medications for this visit.    Allergies  Allergen Reactions  . Penicillins Other (See Comments)    Childhood     History   Social History  . Marital Status: Married    Spouse Name: N/A    Number of Children: 0  . Years of Education: N/A     Occupational History  . 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 Smoking status: Current Every Day Smoker -- 1.00 packs/day for 45 years    Types: Cigarettes  . Smokeless tobacco: Never Used  . Alcohol Use: 21.0 oz/week    35 Cans of beer per week  . Drug Use: No  . Sexually Active: Not on file   Other Topics Concern  . Not on file   Social History Narrative  . No narrative on file    Family History  Problem Relation Age of Onset  . CAD Neg Hx   . Stroke Brother     Review of Systems:  As stated in the HPI and otherwise negative.   BP 138/82  Pulse 65  Ht 5\' 6"  (1.676 m)  Wt 159 lb (72.122 kg)  BMI 25.68 kg/m2  SpO2 97%  Physical Examination: General: Well developed, well nourished, NAD HEENT: OP clear, mucus membranes moist SKIN: warm, dry. No rashes. Neuro: No focal deficits Musculoskeletal: Muscle strength 5/5 all ext Psychiatric: Mood and affect normal Neck: No JVD, no carotid bruits, no thyromegaly, no lymphadenopathy. Lungs:Clear bilaterally, no wheezes, rhonci, crackles Cardiovascular: Regular rate and rhythm. No murmurs, gallops or rubs. Abdomen:Soft. Bowel sounds present. Non-tender.  Extremities: No lower extremity edema. Pulses are 2 + in the right DP/PT, non-palpable left DP/PT. No ulcerations. Feet are warm to touch.    Assessment and Plan:   1. PAD: The patient has non-lifestyle limiting claudication involving the left calf with flush occlusion of the left SFA and reconstituting distally. This is overall not favorable for endovascular intervention and long-term patency is low with such long occlusions. Thus, I recommend continuing medical therapy.   2. renal artery stenosis : The patient lost at least 50% of his left kidney due to occlusion of the superior renal artery. The lower renal artery had a 90% ostial stenosis. The right renal arteries with a 60% ostial stenosis. I had a prolonged discussion with him and his wife  about management of this. Currently he does not have refractory hypertension or chronic kidney disease. However, the disease in the ostial left renal artery will most likely progress to total occlusion with loss of residual left kidney function. Due to that I recommend proceeding with selective renal angiography and left renal artery stenting. I will also have to selectively evaluate the right renal artery which most likely will be treated medically. Risks, and benefits were discussed with the patient and he wants to think about this.  3. Tobacco Abuse:  I strongly advised him to quit smoking.

## 2012-10-13 ENCOUNTER — Encounter: Payer: Self-pay | Admitting: Cardiology

## 2012-10-13 ENCOUNTER — Ambulatory Visit (INDEPENDENT_AMBULATORY_CARE_PROVIDER_SITE_OTHER): Payer: Managed Care, Other (non HMO) | Admitting: Cardiology

## 2012-10-13 VITALS — BP 138/70 | HR 64 | Ht 66.0 in | Wt 159.0 lb

## 2012-10-13 DIAGNOSIS — I2510 Atherosclerotic heart disease of native coronary artery without angina pectoris: Secondary | ICD-10-CM

## 2012-10-13 DIAGNOSIS — I428 Other cardiomyopathies: Secondary | ICD-10-CM

## 2012-10-13 DIAGNOSIS — I6529 Occlusion and stenosis of unspecified carotid artery: Secondary | ICD-10-CM

## 2012-10-13 DIAGNOSIS — I429 Cardiomyopathy, unspecified: Secondary | ICD-10-CM

## 2012-10-13 DIAGNOSIS — I739 Peripheral vascular disease, unspecified: Secondary | ICD-10-CM

## 2012-10-13 DIAGNOSIS — I10 Essential (primary) hypertension: Secondary | ICD-10-CM

## 2012-10-13 DIAGNOSIS — I6350 Cerebral infarction due to unspecified occlusion or stenosis of unspecified cerebral artery: Secondary | ICD-10-CM

## 2012-10-13 DIAGNOSIS — I639 Cerebral infarction, unspecified: Secondary | ICD-10-CM

## 2012-10-13 DIAGNOSIS — I701 Atherosclerosis of renal artery: Secondary | ICD-10-CM

## 2012-10-13 DIAGNOSIS — R918 Other nonspecific abnormal finding of lung field: Secondary | ICD-10-CM

## 2012-10-13 DIAGNOSIS — F17200 Nicotine dependence, unspecified, uncomplicated: Secondary | ICD-10-CM

## 2012-10-13 NOTE — Patient Instructions (Addendum)
Schedule an appointment for a non contrast CT of your chest to be done in June 2014.  Your physician wants you to follow-up in: 6 months with Dr Shirlee Latch. (August 2014). You will receive a reminder letter in the mail two months in advance. If you don't receive a letter, please call our office to schedule the follow-up appointment.   Your physician recommends that you return for a FASTING lipid profile in August 2014.

## 2012-10-14 ENCOUNTER — Other Ambulatory Visit: Payer: Self-pay | Admitting: Cardiology

## 2012-10-14 NOTE — Progress Notes (Signed)
Patient ID: Darrell Willis, male   DOB: 06-Aug-1945, 67 y.o.   MRN: [id number] PCP: Dr. Samuel Germany  67 yo with history of recent CVA and L CEA presents for cardiology followup.  Patient was admitted to [facility] in 11/13 with RUE weakness.  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  CEA was planned for tomorrow.  He was sent for preoperative Lexiscan myoview showing EF 35% but no evidence for ischemia or infarction.  Echo was then done, showing EF 40% with apical hypokinesis. LHC prior to CEA showed 90% proximal RCA and 70% mid LAD.  He denied exertional dyspnea or chest pain so intervention was deferred.  He underwent CEA without complication. Peripheral arterial dopplers done recently were suggestive of left SFA occlusion and he had significant claudication.  Peripheral angiogram was done in 2/14, showing occluded L SFA with collaterals reconstituting the vessel distally.  He also had significant renal artery stenosis with occlusion of a left upper renal artery and 90% stenosis of a left lower renal artery.  Mr Dyment continues to deny chest pain.  He has mild DOE walking up hills, otherwise he tends to do ok with exertion.  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  Main exertional complaint continues to be left calf pain.  He notes this with walking after about 100-200 feet.  If he rests briefly the pain resolves.  BP is within goal range today. No foot ulcers. He is still smoking but has cut back.    Labs (11/13): Na 124, K 4.6, creatinine 0.81  Labs (1/14): K 5.2 (spironolactone stopped), creatinine 0.9, LDL 54, HDL 47 Labs (2/14): K 4.5, Na 129, creatinine 1.0, HCT 32.8  ECG: NSR, LVH  PMH: 1. HTN 2. Hyperlipidemia 3. CVA: 11/13 admission to [facility] with right arm weakness and was found to have acute CVA.  L MCA infarct was found.  Carotid dopplers showed > 70% bilateral ICA stenosis.  CTA neck showed severe left ICA stenosis.  Patient had left  CEA in 12/13.  4. Active smoker, 1 ppd.  5. Cardiomyopathy: Probably ischemic.  Lexiscan myoview (12/13) with EF 35%, global hypokinesis, no scar or ischemia. Echo (12/13) with EF 40-45%, mild LVH, apical hypokinesis.  EF 50% on LV-gram in 12/13.  6. CAD: LHC (12/13) with EF 50%, basal inferior hypokinesis, 70% mLAD, 90% pRCA.  Patient was medically managed.  7. PAD: ABIs (1/14) with left SFA occlusion, reconstitutes above the knee; toe PPG at risk for tissue loss on left.  Peripheral angiogram in 2/14 with total occlusion of the left SFA with reconstitution of the distal vessel via collaterals.   8. CT chest 12/13 with 2 small nodules.  9. Renal artery stenosis: 2/14 angiogram with total occlusion of left upper pole renal artery and 90% ostial stenosis of left lower pole renal artery.  60% ostial right renal artery.   SH: Married, lives in [place], smokes 1 ppd, Designer, industrial/product.   FH: No known premature CAD.   ROS: All systems reviewed and negative except as per HPI.   Current Outpatient Prescriptions  Medication Sig Dispense Refill  . amLODipine (NORVASC) 10 MG tablet Take 1 tablet (10 mg total) by mouth at bedtime.  30 tablet  11  . aspirin EC 81 MG tablet Take 81 mg by mouth at bedtime.       [name] atorvastatin (LIPITOR) 40 MG tablet Take 40 mg by mouth at bedtime.      [name]  carvedilol (COREG) 6.25 MG tablet Take 18.75 mg by mouth 2 (two) times daily with a meal.      . clopidogrel (PLAVIX) 75 MG tablet Take 75 mg by mouth at bedtime.       . cyclobenzaprine (FLEXERIL) 10 MG tablet Take 10 mg by mouth 3 (three) times daily as needed for muscle spasms.      . valsartan-hydrochlorothiazide (DIOVAN-HCT) 320-12.5 MG per tablet Take 1 tablet by mouth daily.       No current facility-administered medications for this visit.   BP 138/70  Pulse 64  Ht 5\' 6"  (1.676 m)  Wt 159 lb (72.122 kg)  BMI 25.68 kg/m2 General: NAD Neck: No JVD, no thyromegaly or thyroid nodule.  Lungs: Clear to  auscultation with prolonged expiratory phase.  CV: Nondisplaced PMI.  Heart regular S1/S2, no S3/S4, no murmur.  No peripheral edema.  Bilateral carotid bruits.  Absent left pedal pulses.  Abdomen: Soft, nontender, no hepatosplenomegaly, no distention.  Neurologic: Alert and oriented x 3.  Psych: Normal affect. Extremities: No clubbing or cyanosis.   Assessment/Plan: 1. Cardiomyopathy: EF in range of 40-45%, probably ischemic cardiomyopathy.  Not volume overloaded on exam.  Continue valsartan, keep Coreg at 18.75 mg bid (lightheaded if when it was increased to 25 mg bid).     2. CVA: Status post left CEA without complication.  RICA stenosis to be followed by [facility]. Continue Plavix, ASA.  3. Smoking: I again strongly encouraged the patient to quit smoking. He is going to try an electronic cigarette. 4. HTN: Better control.  He also has renal artery stenosis and will be undergoing intervention.  5. Claudication: Left SFA occlusion with collaterals.  I reviewed Dr. Jari Sportsman note, and it appears that this would be difficult to approach percutaneously.  For now, will treat via a walking program.  Would probably hold off on cilostazol with decreased LV systolic function. He is already trying to walk through the pain.  No rest pain or foot ulcers.  6. CAD: 90% pRCA, 70% mLAD on pre-CEA cath.  Given no chest pain or dyspnea, he was not intervened upon.  He continues to deny chest pain or significant dyspnea.  Will continue medical management for now with ASA, Plavix, Coreg, atorvastatin 40, and valsartan.  7. Hyperlipidemia: Lipids improved in 1/14, continue current dose of atorvastatin.  8. Lung nodules: Repeat chest CT w/o contrast in 6/14.  9. Renal artery stenosis: He already has complete occlusion of the left upper renal artery and has 90% stenosis of the left lower renal artery.  He is at risk for total loss of circulation to the left kidney.  I agree with Dr. Kirke Corin that intervention is warranted to try  to preserve left kidney function and potentially to improve BP control.   Marca Ancona 10/14/2012   Marca Ancona 10/14/2012

## 2012-10-24 ENCOUNTER — Encounter: Payer: Self-pay | Admitting: Cardiovascular Disease

## 2012-10-26 ENCOUNTER — Encounter: Payer: Self-pay | Admitting: *Deleted

## 2012-10-26 ENCOUNTER — Telehealth: Payer: Self-pay | Admitting: Cardiology

## 2012-10-26 ENCOUNTER — Other Ambulatory Visit: Payer: Self-pay | Admitting: *Deleted

## 2012-10-26 DIAGNOSIS — Z01812 Encounter for preprocedural laboratory examination: Secondary | ICD-10-CM

## 2012-10-26 NOTE — Telephone Encounter (Signed)
RENAL ANGIOGRAM SCHEDULE  PT'S WIFE AWARE  OF INSTRUCTIONS AND COPY TO BE FAXED AS WELL AND PT HAVING LAB DONE ANYTIME AFTER 11-01-12  AT [facility] OFFICE .Zack Seal

## 2012-10-26 NOTE — Telephone Encounter (Signed)
New Prob     Pt has some questions regarding angiogram with stenting. Would like to speak to nurse.

## 2012-10-27 ENCOUNTER — Telehealth: Payer: Self-pay

## 2012-10-27 ENCOUNTER — Encounter: Payer: Self-pay | Admitting: Cardiovascular Disease

## 2012-10-27 NOTE — Telephone Encounter (Signed)
Call pt with instructions [id number] 4/30

## 2012-10-30 ENCOUNTER — Encounter (HOSPITAL_COMMUNITY): Payer: Self-pay

## 2012-10-30 ENCOUNTER — Encounter: Payer: Self-pay | Admitting: Cardiovascular Disease

## 2012-10-30 ENCOUNTER — Encounter: Payer: Self-pay | Admitting: *Deleted

## 2012-10-30 NOTE — Telephone Encounter (Signed)
Scherrie Bateman, LPN, in [place] handling this

## 2012-11-01 ENCOUNTER — Encounter: Payer: Self-pay | Admitting: Cardiovascular Disease

## 2012-11-01 ENCOUNTER — Other Ambulatory Visit: Payer: Managed Care, Other (non HMO)

## 2012-11-03 ENCOUNTER — Other Ambulatory Visit (INDEPENDENT_AMBULATORY_CARE_PROVIDER_SITE_OTHER): Payer: Managed Care, Other (non HMO)

## 2012-11-03 DIAGNOSIS — Z01812 Encounter for preprocedural laboratory examination: Secondary | ICD-10-CM

## 2012-11-03 LAB — CBC WITH DIFFERENTIAL/PLATELET
Basophils Relative: 0.7 % (ref 0.0–3.0)
Eosinophils Relative: 3.6 % (ref 0.0–5.0)
HCT: 36 % — ABNORMAL LOW (ref 39.0–52.0)
Hemoglobin: 12.1 g/dL — ABNORMAL LOW (ref 13.0–17.0)
Lymphs Abs: 2 10*3/uL (ref 0.7–4.0)
MCV: 92.6 fl (ref 78.0–100.0)
Monocytes Relative: 7.3 % (ref 3.0–12.0)
Neutro Abs: 5.1 10*3/uL (ref 1.4–7.7)
Platelets: 211 10*3/uL (ref 150.0–400.0)
RBC: 3.89 Mil/uL — ABNORMAL LOW (ref 4.22–5.81)
WBC: 8.1 10*3/uL (ref 4.5–10.5)

## 2012-11-03 LAB — BASIC METABOLIC PANEL
BUN: 21 mg/dL (ref 6–23)
CO2: 23 mEq/L (ref 19–32)
Calcium: 8.6 mg/dL (ref 8.4–10.5)
Glucose, Bld: 82 mg/dL (ref 70–99)
Sodium: 130 mEq/L — ABNORMAL LOW (ref 135–145)

## 2012-11-08 ENCOUNTER — Encounter (HOSPITAL_COMMUNITY)
Admission: RE | Disposition: A | Discharge status: Home / Self Care | Payer: Self-pay | Source: Ambulatory Visit | Attending: Cardiovascular Disease

## 2012-11-08 ENCOUNTER — Ambulatory Visit (HOSPITAL_COMMUNITY)
Admission: RE | Admit: 2012-11-08 | Discharge: 2012-11-08 | Disposition: A | Discharge status: Home / Self Care | Payer: Managed Care, Other (non HMO) | Source: Ambulatory Visit | Attending: Cardiovascular Disease | Admitting: Cardiovascular Disease

## 2012-11-08 DIAGNOSIS — I701 Atherosclerosis of renal artery: Secondary | ICD-10-CM

## 2012-11-08 HISTORY — PX: RENAL ANGIOGRAM: SHX5509

## 2012-11-08 HISTORY — PX: RENAL ARTERY STENT: SHX2321

## 2012-11-08 LAB — POCT ACTIVATED CLOTTING TIME: Activated Clotting Time: 181 seconds

## 2012-11-08 SURGERY — RENAL ANGIOGRAM
Anesthesia: LOCAL

## 2012-11-08 MED ORDER — SODIUM CHLORIDE 0.9 % IV SOLN: INTRAVENOUS | Status: DC

## 2012-11-08 MED ORDER — ASPIRIN 81 MG PO CHEW
CHEWABLE_TABLET | ORAL | Status: AC
  Filled 2012-11-08: qty 4

## 2012-11-08 MED ORDER — ASPIRIN 81 MG PO CHEW
324.0000 mg | CHEWABLE_TABLET | ORAL | Status: AC
  Administered 2012-11-08: 324 mg via ORAL

## 2012-11-08 MED ORDER — LIDOCAINE HCL (PF) 1 % IJ SOLN
INTRAMUSCULAR | Status: AC
  Filled 2012-11-08: qty 30

## 2012-11-08 MED ORDER — FENTANYL CITRATE 0.05 MG/ML IJ SOLN
INTRAMUSCULAR | Status: AC
  Filled 2012-11-08: qty 2

## 2012-11-08 MED ORDER — HEPARIN (PORCINE) IN NACL 2-0.9 UNIT/ML-% IJ SOLN
INTRAMUSCULAR | Status: AC
  Filled 2012-11-08: qty 1000

## 2012-11-08 MED ORDER — ACETAMINOPHEN 325 MG PO TABS: 650.0000 mg | ORAL_TABLET | ORAL | Status: DC | PRN

## 2012-11-08 MED ORDER — SODIUM CHLORIDE 0.9 % IV SOLN
INTRAVENOUS | Status: DC
  Administered 2012-11-08: 07:00:00 via INTRAVENOUS

## 2012-11-08 MED ORDER — HEPARIN SODIUM (PORCINE) 1000 UNIT/ML IJ SOLN
INTRAMUSCULAR | Status: AC
  Filled 2012-11-08: qty 1

## 2012-11-08 MED ORDER — MIDAZOLAM HCL 2 MG/2ML IJ SOLN
INTRAMUSCULAR | Status: AC
  Filled 2012-11-08: qty 2

## 2012-11-08 NOTE — Interval H&P Note (Signed)
History and Physical Interval Note:  11/08/2012 8:42 AM  Darrell Willis  has presented today for surgery, with the diagnosis of Claudication  The various methods of treatment have been discussed with the patient and family. After consideration of risks, benefits and other options for treatment, the patient has consented to  Procedure(s): RENAL ANGIOGRAM (N/A) as a surgical intervention .  The patient's history has been reviewed, patient examined, no change in status, stable for surgery.  I have reviewed the patient's chart and labs.  Questions were answered to the patient's satisfaction.     [name]

## 2012-11-08 NOTE — H&P (View-Only) (Signed)
Patient ID: Darrell Willis, male   DOB: 08/21/1945, 67 y.o.   MRN: [id number] PCP: Dr. Gage  67 yo with history of recent CVA and L CEA presents for cardiology followup.  Patient was admitted to [facility] in 11/13 with RUE weakness.  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  CEA was planned for tomorrow.  He was sent for preoperative Lexiscan myoview showing EF 35% but no evidence for ischemia or infarction.  Echo was then done, showing EF 40% with apical hypokinesis. LHC prior to CEA showed 90% proximal RCA and 70% mid LAD.  He denied exertional dyspnea or chest pain so intervention was deferred.  He underwent CEA without complication. Peripheral arterial dopplers done recently were suggestive of left SFA occlusion and he had significant claudication.  Peripheral angiogram was done in 2/14, showing occluded L SFA with collaterals reconstituting the vessel distally.  He also had significant renal artery stenosis with occlusion of a left upper renal artery and 90% stenosis of a left lower renal artery.  Mr Stamas continues to deny chest pain.  He has mild DOE walking up hills, otherwise he tends to do ok with exertion.  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  Main exertional complaint continues to be left calf pain.  He notes this with walking after about 100-200 feet.  If he rests briefly the pain resolves.  BP is within goal range today. No foot ulcers. He is still smoking but has cut back.    Labs (11/13): Na 124, K 4.6, creatinine 0.81  Labs (1/14): K 5.2 (spironolactone stopped), creatinine 0.9, LDL 54, HDL 47 Labs (2/14): K 4.5, Na 129, creatinine 1.0, HCT 32.8  ECG: NSR, LVH  PMH: 1. HTN 2. Hyperlipidemia 3. CVA: 11/13 admission to [facility] with right arm weakness and was found to have acute CVA.  L MCA infarct was found.  Carotid dopplers showed > 70% bilateral ICA stenosis.  CTA neck showed severe left ICA stenosis.  Patient had left  CEA in 12/13.  4. Active smoker, 1 ppd.  5. Cardiomyopathy: Probably ischemic.  Lexiscan myoview (12/13) with EF 35%, global hypokinesis, no scar or ischemia. Echo (12/13) with EF 40-45%, mild LVH, apical hypokinesis.  EF 50% on LV-gram in 12/13.  6. CAD: LHC (12/13) with EF 50%, basal inferior hypokinesis, 70% mLAD, 90% pRCA.  Patient was medically managed.  7. PAD: ABIs (1/14) with left SFA occlusion, reconstitutes above the knee; toe PPG at risk for tissue loss on left.  Peripheral angiogram in 2/14 with total occlusion of the left SFA with reconstitution of the distal vessel via collaterals.   8. CT chest 12/13 with 2 small nodules.  9. Renal artery stenosis: 2/14 angiogram with total occlusion of left upper pole renal artery and 90% ostial stenosis of left lower pole renal artery.  60% ostial right renal artery.   SH: Married, lives in [place], smokes 1 ppd, plastic mold technician.   FH: No known premature CAD.   ROS: All systems reviewed and negative except as per HPI.   Current Outpatient Prescriptions  Medication Sig Dispense Refill  . amLODipine (NORVASC) 10 MG tablet Take 1 tablet (10 mg total) by mouth at bedtime.  30 tablet  11  . aspirin EC 81 MG tablet Take 81 mg by mouth at bedtime.       . atorvastatin (LIPITOR) 40 MG tablet Take 40 mg by mouth at bedtime.      .   carvedilol (COREG) 6.25 MG tablet Take 18.75 mg by mouth 2 (two) times daily with a meal.      . clopidogrel (PLAVIX) 75 MG tablet Take 75 mg by mouth at bedtime.       . cyclobenzaprine (FLEXERIL) 10 MG tablet Take 10 mg by mouth 3 (three) times daily as needed for muscle spasms.      . valsartan-hydrochlorothiazide (DIOVAN-HCT) 320-12.5 MG per tablet Take 1 tablet by mouth daily.       No current facility-administered medications for this visit.   BP 138/70  Pulse 64  Ht 5' 6" (1.676 m)  Wt 159 lb (72.122 kg)  BMI 25.68 kg/m2 General: NAD Neck: No JVD, no thyromegaly or thyroid nodule.  Lungs: Clear to  auscultation with prolonged expiratory phase.  CV: Nondisplaced PMI.  Heart regular S1/S2, no S3/S4, no murmur.  No peripheral edema.  Bilateral carotid bruits.  Absent left pedal pulses.  Abdomen: Soft, nontender, no hepatosplenomegaly, no distention.  Neurologic: Alert and oriented x 3.  Psych: Normal affect. Extremities: No clubbing or cyanosis.   Assessment/Plan: 1. Cardiomyopathy: EF in range of 40-45%, probably ischemic cardiomyopathy.  Not volume overloaded on exam.  Continue valsartan, keep Coreg at 18.75 mg bid (lightheaded if when it was increased to 25 mg bid).     2. CVA: Status post left CEA without complication.  RICA stenosis to be followed by [facility]. Continue Plavix, ASA.  3. Smoking: I again strongly encouraged the patient to quit smoking. He is going to try an electronic cigarette. 4. HTN: Better control.  He also has renal artery stenosis and will be undergoing intervention.  5. Claudication: Left SFA occlusion with collaterals.  I reviewed Dr. Arida's note, and it appears that this would be difficult to approach percutaneously.  For now, will treat via a walking program.  Would probably hold off on cilostazol with decreased LV systolic function. He is already trying to walk through the pain.  No rest pain or foot ulcers.  6. CAD: 90% pRCA, 70% mLAD on pre-CEA cath.  Given no chest pain or dyspnea, he was not intervened upon.  He continues to deny chest pain or significant dyspnea.  Will continue medical management for now with ASA, Plavix, Coreg, atorvastatin 40, and valsartan.  7. Hyperlipidemia: Lipids improved in 1/14, continue current dose of atorvastatin.  8. Lung nodules: Repeat chest CT w/o contrast in 6/14.  9. Renal artery stenosis: He already has complete occlusion of the left upper renal artery and has 90% stenosis of the left lower renal artery.  He is at risk for total loss of circulation to the left kidney.  I agree with Dr. Arida that intervention is warranted to try  to preserve left kidney function and potentially to improve BP control.   Analeah Brame 10/14/2012   Oddis Westling 10/14/2012   

## 2012-11-08 NOTE — CV Procedure (Signed)
     PERIPHERAL VASCULAR PROCEDURE  NAME:  Darrell Willis   MRN: [id number] DOB:  [birth year]   ADMIT DATE: 11/08/2012  Performing Cardiologist: [name] Primary Physician: Renae Fickle, MD Primary Cardiologist:  Dr. Shirlee Latch.   Procedures Performed:  Abdominal Aortic Angiogram without Bi-Iliofemoral Runoff  Selective left renal artery angiography  Left renal artery angioplasty and stent placement.   Indication(s):  Severe renal artery stenosis. On previous angiography, he was noted to have an occluded left superior renal artery with significant ostial stenosis in the left inferior renal artery. There was at least moderate right renal artery stenosis.   Consent: The procedure with Risks/Benefits/Alternatives and Indications was reviewed with the patient .  All questions were answered.  Medications:  Sedation:  1 mg IV Versed, 25 mcg IV Fentanyl  Contrast:  39 mL of Visipaque  Procedural details: The right groin was prepped, draped, and anesthetized with 1% lidocaine. Using modified Seldinger technique, a 6 French sheath was introduced into the right common femoral artery. A 5 Fr Short Pigtail Catheter was advanced of over a  Versicore wire into the descending Aorta to a level just above the renal arteries. A power injection of 28ml/sec contrast over 1 sec was performed for Abdominal Aortic Angiography.    Interventional Procedure:  The patient was given 4000 units of unfractionated heparin intravenously with an ACT of 230. The pigtail catheter was a change over the Versicore wire for a 6 [facility] guide. I engaged the left renal artery and there was significant pressure dampening due to severe ostial stenosis. I quickly advanced a [place] core wire which crossed the lesion. The guide was pulled back. The lesion was predilated with a 4.0 x 15 mm balloon. Angiography showed significant she called at the ostium. I then placed a 5 x 12 mm Herculink stent which was deployed to 10  atmospheres. The balloon was pulled back and inflated again to flare the ostium to 12 atmosphere. Angiography showed excellent results with no residual stenosis. There was no residual gradient. The wire and the balloon were removed. The guiding catheter was removed over a wire. The sheath was left in place to be removed manually.  Hemodynamics:  Central Aortic Pressure / Mean Aortic Pressure: 161/65  Findings:  Abdominal aorta: Diffuse atherosclerosis with no evidence of aneurysm or obstructive disease.  Left renal artery: The left superior renal artery is occluded at the ostium. The inferior renal artery has 99% ostial stenosis. This actually looks worse than recent angiography.  Right renal artery: There are 2 right renal arteries which are very close in origin. Both has about 60-70% ostial stenosis.   Conclusions: Successful angioplasty and stent placement to the left inferior renal artery for initial stenosis of 99% which was reduced to 0% with no residual stenosis.  Recommendations:  Continue medical therapy and dual antiplatelet therapy. The right renal arteries can be monitored noninvasively.   [name] MD, [facility] 11/08/2012 9:22 AM

## 2012-11-24 ENCOUNTER — Encounter: Payer: Self-pay | Admitting: Cardiovascular Disease

## 2012-11-28 ENCOUNTER — Ambulatory Visit (INDEPENDENT_AMBULATORY_CARE_PROVIDER_SITE_OTHER): Payer: Managed Care, Other (non HMO) | Admitting: Cardiovascular Disease

## 2012-11-28 ENCOUNTER — Encounter: Payer: Self-pay | Admitting: Cardiovascular Disease

## 2012-11-28 VITALS — BP 132/68 | HR 65 | Ht 67.0 in | Wt 164.0 lb

## 2012-11-28 DIAGNOSIS — I701 Atherosclerosis of renal artery: Secondary | ICD-10-CM

## 2012-11-28 MED ORDER — CLOPIDOGREL BISULFATE 75 MG PO TABS: 75.0000 mg | ORAL_TABLET | Freq: Every day | ORAL | Status: DC

## 2012-11-28 MED ORDER — CARVEDILOL 12.5 MG PO TABS: 12.5000 mg | ORAL_TABLET | Freq: Two times a day (BID) | ORAL | Status: DC

## 2012-11-28 NOTE — Patient Instructions (Addendum)
Your physician has requested that you have a renal artery duplex in 3 months. During this test, an ultrasound is used to evaluate blood flow to the kidneys. Allow one hour for this exam. Do not eat after midnight the day before and avoid carbonated beverages. Take your medications as you usually do.  Change Coreg dose to 12.5mg  twice daily.  New Rx.  Your physician wants you to follow-up in: 6 months with Dr. Kirke Corin.  You will receive a reminder letter in the mail two months in advance. If you don't receive a letter, please call our office to schedule the follow-up appointment.

## 2012-11-29 ENCOUNTER — Telehealth: Payer: Self-pay

## 2012-11-29 NOTE — Telephone Encounter (Signed)
I received t/c from Jodette, RN who says she rec'd message from front re:CT scan that is being denied by insur This order was placed by Dr. Shirlee Latch. I will send to his nurse.

## 2012-11-29 NOTE — Telephone Encounter (Signed)
Deferred to [facility] in precert.

## 2012-11-30 ENCOUNTER — Encounter: Payer: Self-pay | Admitting: Cardiovascular Disease

## 2012-11-30 NOTE — Progress Notes (Signed)
History of Present Illness: 67 yo male with history of CVA and L CEA, CAD, ischemic cardiomyopathy ,  PAD and renal artery stenosis status post recent stenting who is here today for a followup visit  . His cardiac issues are followed by Dr. Shirlee Latch.  Patient was admitted to [facility] in 11/13 with RUE weakness. 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  He was sent for preoperative Lexiscan myoview showing EF 35% but no evidence for ischemia or infarction. Echo was then done, showing EF 40% with apical hypokinesis. LHC prior to CEA showed 90% proximal RCA and 70% mid LAD. He denied exertional dyspnea or chest pain so intervention was deferred. He underwent CEA without complication. His CAD has been managed medically. He has known history of PAD with long occlusion of the left SFA at the ostium with reconstitution just above the knee, ABI 0.51 on the left. Right ABI is 1.1. He treated medically for this. He was also found to have an occluded left subcutaneous to renal artery with significant stenosis involving the left lower renal artery and moderate right renal artery stenosis (2 branches).  I performed angioplasty and stent placement to the left lower renal artery recently without complications. Overall, he has been doing reasonably well but complains of significant dizziness every time he takes carvedilol.  Past Medical History  Diagnosis Date  . HTN (hypertension)   . HLD (hyperlipidemia)   . Stroke 05/29/2012    11/13 admission to [facility] with right arm weakness and was found to have acute CVA. L MCA infarct was found. Carotid dopplers showed > 70% bilateral ICA stenosis. CTA neck showed severe left ICA stenosis. S/p L CEA 06/2012 ([place])  . Tobacco abuse     1 PPD  . Cardiomyopathy     Lexiscan myoview (12/13) with EF 35%, global hypokinesis, no scar or ischemia. Echo (12/13) with EF 40-45%, mild LVH, apical hypokinesis.  [name] CAD (coronary artery  disease)     LHC 06/16/12: dLM 20%, pLAD 30%, mLAD 70%, oCFX 40%, pOM2 50-60%, pOM3 40%, pRCA 90%, EF 50% with basal to mid inferior HK. => Med Rx unless angina -> consider PCI of RCA  . Lung nodule     Chest CT 06/22/12:  2 small R lung nodules => needs follow up CT 12/2012  . PAD (peripheral artery disease)     a. ABIs 07/24/12:  R 1.1, L 0.51, flush occlusion of L SFA with reconstitution above the knee;  bilat great toe pressures abnormal at risk for tissue loss on left  . Renal artery stenosis     Past Surgical History  Procedure Laterality Date  . Endarterectomy  06/22/2012    Procedure: ENDARTERECTOMY CAROTID;  Surgeon: Nada Libman, MD;  Location: [facility] OR;  Service: Vascular;  Laterality: Left;  . Patch angioplasty  06/22/2012    Procedure: PATCH ANGIOPLASTY;  Surgeon: Nada Libman, MD;  Location: [facility] OR;  Service: Vascular;  Laterality: Left;  Vascu-Guard Patch Angioplasty    Current Outpatient Prescriptions  Medication Sig Dispense Refill  . amLODipine (NORVASC) 10 MG tablet Take 1 tablet (10 mg total) by mouth at bedtime.  30 tablet  11  . aspirin EC 81 MG tablet Take 81 mg by mouth at bedtime.       [name] atorvastatin (LIPITOR) 40 MG tablet Take 40 mg by mouth daily.      . carvedilol (COREG) 12.5 MG tablet Take 1 tablet (12.5  mg total) by mouth 2 (two) times daily with a meal.  180 tablet  3  . clopidogrel (PLAVIX) 75 MG tablet Take 1 tablet (75 mg total) by mouth at bedtime.  90 tablet  3  . cyclobenzaprine (FLEXERIL) 10 MG tablet Take 10 mg by mouth at bedtime.       . valsartan-hydrochlorothiazide (DIOVAN-HCT) 320-12.5 MG per tablet Take 1 tablet by mouth daily.       No current facility-administered medications for this visit.    Allergies  Allergen Reactions  . Penicillins Other (See Comments)    Childhood     History   Social History  . Marital Status: Married    Spouse Name: N/A    Number of Children: 0  . Years of Education: N/A   Occupational History  .  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 Smoking status: Current Every Day Smoker -- 1.00 packs/day for 45 years    Types: Cigarettes  . Smokeless tobacco: Never Used  . Alcohol Use: 21.0 oz/week    35 Cans of beer per week  . Drug Use: No  . Sexually Active: Not on file   Other Topics Concern  . Not on file   Social History Narrative  . No narrative on file    Family History  Problem Relation Age of Onset  . CAD Neg Hx   . Stroke Brother     Review of Systems:  As stated in the HPI and otherwise negative.   BP 132/68  Pulse 65  Ht 5\' 7"  (1.702 m)  Wt 164 lb (74.39 kg)  BMI 25.68 kg/m2  SpO2 98%  Physical Examination: General: Well developed, well nourished, NAD HEENT: OP clear, mucus membranes moist SKIN: warm, dry. No rashes. Neuro: No focal deficits Musculoskeletal: Muscle strength 5/5 all ext Psychiatric: Mood and affect normal Neck: No JVD, no carotid bruits, no thyromegaly, no lymphadenopathy. Lungs:Clear bilaterally, no wheezes, rhonci, crackles Cardiovascular: Regular rate and rhythm. No murmurs, gallops or rubs. Abdomen:Soft. Bowel sounds present. Non-tender.  Extremities: No lower extremity edema. Pulses are 2 + in the right DP/PT, non-palpable left DP/PT. No ulcerations. Feet are warm to touch.  No groin hematoma  Assessment and Plan:   1. PAD: The patient has non-lifestyle limiting claudication involving the left calf with flush occlusion of the left SFA and reconstituting distally. This is overall not favorable for endovascular intervention and long-term patency is low with such long occlusions. Thus, I recommend continuing medical therapy. I strongly advised him to quit smoking and start a walking program.  2. renal artery stenosis : He underwent a recent successful left renal artery stenting without complications. Treatment with Plavix is recommended for one month. However, given his extensive cardiovascular history including  PAD, carotid disease and coronary artery disease, he  Likely would  benefit from being on this long-term. check renal artery duplex ultrasound in 3 months.   3. Tobacco Abuse:  I strongly advised him to quit smoking.   4. Coronary artery disease with cardiomyopathy: He seems to be having significant dizziness when he takes carvedilol. Thus, I went ahead and decreased the dose  to 12.5 mg twice daily.

## 2012-12-05 ENCOUNTER — Telehealth: Payer: Self-pay | Admitting: Cardiology

## 2012-12-05 NOTE — Telephone Encounter (Signed)
I see him for PAD. Dr. Shirlee Latch is his cardiologist. It seems that he should see his PCP about this.

## 2012-12-05 NOTE — Telephone Encounter (Signed)
Wife says pt has been taking all meds except amlodipine at hs before bed Says about 1 hour after taking PM meds, he begins to vomit and does this multiple times throughout night He is on no new meds and says he has always taken meds this way, at hs Afebrile Says he has always had diarrhea but this is not worse Has abdominal pain in mornings after vomiting all night BP today was 139/80 I told wife I would let Dr. Kirke Corin know and call her back with his advice Understanding verb

## 2012-12-05 NOTE — Telephone Encounter (Signed)
New problem    C/O for a past couple of night . Vomiting  for hours. Please clarify  Which mediation will be causing this.

## 2012-12-05 NOTE — Telephone Encounter (Signed)
Will forward to [facility]

## 2012-12-06 NOTE — Telephone Encounter (Signed)
Wife informed She verb understanding and will call PCP to make appt Says pt did not vomit last night and was able to rest well

## 2012-12-11 ENCOUNTER — Other Ambulatory Visit: Payer: Managed Care, Other (non HMO)

## 2012-12-26 ENCOUNTER — Telehealth: Payer: Self-pay | Admitting: Surgery

## 2012-12-26 NOTE — Telephone Encounter (Addendum)
Message copied by Fredrich Birks on Tue Dec 26, 2012 12:26 PM ------      Message from: Nada Libman      Created: Mon Dec 25, 2012  4:24 PM      Regarding: RE: CT Chest       pls let patient know that it was denied, and document it in the chart      ----- Message -----         From: Fredrich Birks         Sent: 12/25/2012   3:58 PM           To: Nada Libman, MD      Subject: CT Chest                                                 Dr Myra Gianotti,            Darrell Willis is due for his CT Chest to f/u lung nodule on 01/08/13. Unfortunately, Darrell Willis brought a denial to me from his insurance company. The CT must be 1 year after his last CT, which would be December 2014. It looks like [facility] [employer] has him scheduled for 06/2013 CT chest anyway.             Do you want to try peer to peer or just see Darrell Tapanes for his carotid u/s and office visit and let [facility] follow up lung nodule?            Thanks,      Annabelle Harman              ------   12/26/12: Spoke with Darrell Willis, pts wife, to inform. She is aware and will let Darrell Willis know. They are also aware they need to keep his carotid and MD visit at 11:00am on 01/08/13, dpm

## 2013-01-05 ENCOUNTER — Encounter: Payer: Self-pay | Admitting: Surgery

## 2013-01-08 ENCOUNTER — Ambulatory Visit (INDEPENDENT_AMBULATORY_CARE_PROVIDER_SITE_OTHER): Payer: Managed Care, Other (non HMO) | Admitting: Surgery

## 2013-01-08 ENCOUNTER — Other Ambulatory Visit: Payer: Managed Care, Other (non HMO)

## 2013-01-08 ENCOUNTER — Encounter: Payer: Self-pay | Admitting: Surgery

## 2013-01-08 ENCOUNTER — Other Ambulatory Visit (INDEPENDENT_AMBULATORY_CARE_PROVIDER_SITE_OTHER): Payer: Managed Care, Other (non HMO) | Admitting: *Deleted

## 2013-01-08 DIAGNOSIS — Z48812 Encounter for surgical aftercare following surgery on the circulatory system: Secondary | ICD-10-CM

## 2013-01-08 DIAGNOSIS — I6529 Occlusion and stenosis of unspecified carotid artery: Secondary | ICD-10-CM

## 2013-01-08 NOTE — Addendum Note (Signed)
Addended by: Adria Dill L on: 01/08/2013 12:43 PM   Modules accepted: Orders

## 2013-01-08 NOTE — Progress Notes (Signed)
V[facility] and [facility] Specialist of    Patient name: Darrell Willis MRN: [id number] DOB: 1946/04/07 Sex: male     Chief Complaint  Patient presents with  . Carotid    6 month f/u "VQI" pt    HISTORY OF PRESENT ILLNESS: The patient is back today for followup. He is status post left carotid endarterectomy with bovine pericardial patch angioplasty on 06/22/2012. Prior to his operation the patient had a left brain stroke. Operative findings included 95% stenosis. He has no complaints today. He states that he is back to his normal functional status following his stroke. He continues to take a statin for his hypertension. Unfortunately, he continues to smoke. A chest CT scan was done 6 months ago. 6 month followup was recommended however this has not yet been approved.  Past Medical History  Diagnosis Date  . HTN (hypertension)   . HLD (hyperlipidemia)   . Stroke 05/29/2012    11/13 admission to [facility] with right arm weakness and was found to have acute CVA. L MCA infarct was found. Carotid dopplers showed > 70% bilateral ICA stenosis. CTA neck showed severe left ICA stenosis. S/p L CEA 06/2012 ([facility])  . Tobacco abuse     1 PPD  . Cardiomyopathy     Lexiscan myoview (12/13) with EF 35%, global hypokinesis, no scar or ischemia. Echo (12/13) with EF 40-45%, mild LVH, apical hypokinesis.  [name] CAD (coronary artery disease)     LHC 06/16/12: dLM 20%, pLAD 30%, mLAD 70%, oCFX 40%, pOM2 50-60%, pOM3 40%, pRCA 90%, EF 50% with basal to mid inferior HK. => Med Rx unless angina -> consider PCI of RCA  . Lung nodule     Chest CT 06/22/12:  2 small R lung nodules => needs follow up CT 12/2012  . PAD (peripheral artery disease)     a. ABIs 07/24/12:  R 1.1, L 0.51, flush occlusion of L SFA with reconstitution above the knee;  bilat great toe pressures abnormal at risk for tissue loss on left  . Renal artery stenosis   . Carotid artery occlusion     Past Surgical History  Procedure  Laterality Date  . Endarterectomy  06/22/2012    Procedure: ENDARTERECTOMY CAROTID;  Surgeon: Nada Libman, MD;  Location: [facility] OR;  Service: Vascular;  Laterality: Left;  . Patch angioplasty  06/22/2012    Procedure: PATCH ANGIOPLASTY;  Surgeon: Nada Libman, MD;  Location: [facility] OR;  Service: Vascular;  Laterality: Left;  Vascu-Guard Patch Angioplasty  . Renal artery stent    . Carotid endarterectomy      History   Social History  . Marital Status: Married    Spouse Name: N/A    Number of Children: 0  . Years of Education: N/A   Occupational History  . 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 Smoking status: Current Every Day Smoker -- 1.00 packs/day for 45 years    Types: Cigarettes  . Smokeless tobacco: Never Used  . Alcohol Use: 21.0 oz/week    35 Cans of beer per week  . Drug Use: No  . Sexually Active: Not on file   Other Topics Concern  . Not on file   Social History Narrative  . No narrative on file    Family History  Problem Relation Age of Onset  . CAD Neg Hx   . Stroke Brother     Allergies as of 01/08/2013 - Review Complete 01/08/2013  Allergen Reaction  Noted  . Penicillins Other (See Comments) 06/05/2012    Current Outpatient Prescriptions on File Prior to Visit  Medication Sig Dispense Refill  . amLODipine (NORVASC) 10 MG tablet Take 1 tablet (10 mg total) by mouth at bedtime.  30 tablet  11  . aspirin EC 81 MG tablet Take 81 mg by mouth at bedtime.       [name] atorvastatin (LIPITOR) 40 MG tablet Take 40 mg by mouth daily.      . carvedilol (COREG) 12.5 MG tablet Take 1 tablet (12.5 mg total) by mouth 2 (two) times daily with a meal.  180 tablet  3  . clopidogrel (PLAVIX) 75 MG tablet Take 1 tablet (75 mg total) by mouth at bedtime.  90 tablet  3  . cyclobenzaprine (FLEXERIL) 10 MG tablet Take 10 mg by mouth at bedtime.       . valsartan-hydrochlorothiazide (DIOVAN-HCT) 320-12.5 MG per tablet Take 1 tablet by mouth daily.        No current facility-administered medications on file prior to visit.     REVIEW OF SYSTEMS: Cardiovascular: No chest pain, chest pressure, palpitations, orthopnea, or dyspnea on exertion. No claudication or rest pain,  No history of DVT or phlebitis. Pulmonary: No productive cough, asthma or wheezing. Neurologic: No weakness, paresthesias, aphasia, or amaurosis. No dizziness. Hematologic: No bleeding problems or clotting disorders. Musculoskeletal: The patient complains of burning in his thighs. Gastrointestinal: No blood in stool or hematemesis Genitourinary: No dysuria or hematuria. Psychiatric:: No history of major depression. Integumentary: No rashes or ulcers. Constitutional: No fever or chills.  PHYSICAL EXAMINATION:   Vital signs are BP 150/73  Pulse 63  Ht 5\' 7"  (1.702 m)  Wt 168 lb 9.6 oz (76.476 kg)  BMI 26.4 kg/m2  SpO2 99% General: The patient appears their stated age. HEENT:  No gross abnormalities Pulmonary:  Non labored breathing Musculoskeletal: There are no major deformities. Neurologic: No focal weakness or paresthesias are detected, Skin: There are no ulcer or rashes noted. Psychiatric: The patient has normal affect. Cardiovascular: There is a regular rate and rhythm without significant murmur appreciated. No carotid bruit   Diagnostic Studies Duplex ultrasound was ordered and reviewed today. This shows 60-79% stenosis within the right carotid artery. The left side is widely patent.  Assessment: Status post left carotid endarterectomy for symptomatic stenosis Plan: The patient's endarterectomy site is widely patent today. I will need to be close attention to the right side as his stenosis appears to be increasing. He is scheduled for a carotid ultrasound in 6 months.  The patient has 2 areas of concern on a chest CT on the right side. I have tried ordered the followup CT scan. It appears that he is scheduled for a scan in December.  Jorge Ny, M.D. [facility] [place] Office: [phone] Pager:  [phone]   VASCULAR QUALITY INITIATIVE FOLLOW UP DATA:  Current smoker: [ x ] yes  [  ] no  Living status: [ x ]  Home  [  ] Nursing home  [  ] Homeless    MEDS:  ASA [ x ] yes  [  ] no- [  ] medical reason  [  ] non compliant  STATIN  [x  ] yes  [  ] no- [  ] medical reason  [  ] non compliant  Beta blocker [ x ] yes  [  ] no- [  ] medical reason  [  ]  non compliant  ACE inhibitor [x  ] yes  [  ] no- [  ] medical reason  [  ] non compliant  P2Y12 Antagonist [  ] none  [x  ] clopidogrel-Plavix  [  ] ticlopidine-Ticlid   [  ] prasugrel-Effient  [  ] ticagrelor- Brilinta    Anticoagulant [x  ] None  [  ] warfarin  [  ] rivaroxaban-Xarelto [  ] dabigatran- Pradaxa  Neurologic event since D/C:  [x  ] no  [  ] yes: [  ] eye event  [  ] cortical event  [  ] VB event  [  ] non specific event  [  ] right  [  ] left  [  ] TIA  [  ] stroke  Date:   Modified Rankin Score:1  MI since D/C: [ x ] no  [  ] troponin only  [  ] EKG or clinical  Cranial nerve injury: [ x ] none  [  ] resolved  [  ] persistent  Duplex CEA site: [  ] no  [x  ] yes - PSV= 57  EDV= 17  ICA/CCA ratio: 0.79  Stenosis= [x  ] <40% [  ] 40-59% [  ] 60-79%  [  ] > 80%  [  ]  Occluded  CEA site re-operation:  [  x] no   [  ] yes- date of re-op:  CEA site PCI:   [ x ] no   [  ] yes- date of PCI:

## 2013-01-26 ENCOUNTER — Encounter: Payer: Self-pay | Admitting: Cardiology

## 2013-03-01 ENCOUNTER — Encounter (INDEPENDENT_AMBULATORY_CARE_PROVIDER_SITE_OTHER): Payer: Managed Care, Other (non HMO)

## 2013-03-01 DIAGNOSIS — I701 Atherosclerosis of renal artery: Secondary | ICD-10-CM

## 2013-03-01 DIAGNOSIS — I700 Atherosclerosis of aorta: Secondary | ICD-10-CM

## 2013-03-01 DIAGNOSIS — I739 Peripheral vascular disease, unspecified: Secondary | ICD-10-CM

## 2013-03-27 ENCOUNTER — Encounter: Payer: Self-pay | Admitting: Cardiovascular Disease

## 2013-04-03 ENCOUNTER — Ambulatory Visit (INDEPENDENT_AMBULATORY_CARE_PROVIDER_SITE_OTHER): Payer: Managed Care, Other (non HMO) | Admitting: Cardiovascular Disease

## 2013-04-03 ENCOUNTER — Encounter: Payer: Self-pay | Admitting: Cardiovascular Disease

## 2013-04-03 VITALS — BP 160/79 | HR 66 | Wt 171.0 lb

## 2013-04-03 DIAGNOSIS — I739 Peripheral vascular disease, unspecified: Secondary | ICD-10-CM

## 2013-04-03 DIAGNOSIS — I2510 Atherosclerotic heart disease of native coronary artery without angina pectoris: Secondary | ICD-10-CM

## 2013-04-03 DIAGNOSIS — F17200 Nicotine dependence, unspecified, uncomplicated: Secondary | ICD-10-CM

## 2013-04-03 DIAGNOSIS — I701 Atherosclerosis of renal artery: Secondary | ICD-10-CM

## 2013-04-03 NOTE — Progress Notes (Signed)
PCP: Dr. Samuel Germany. Primary cardiologist: Dr. Shirlee Latch  History of Present Illness: 67 yo male with history of CVA and L CEA, CAD, ischemic cardiomyopathy ,  PAD and renal artery stenosis status post stenting in 04/14 who is here today for a followup visit  .  Patient was admitted to [facility] in 11/13 with RUE weakness. 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  He was sent for preoperative Lexiscan myoview showing EF 35% but no evidence for ischemia or infarction. Echo was then done, showing EF 40% with apical hypokinesis. LHC prior to CEA showed 90% proximal RCA and 70% mid LAD. He denied exertional dyspnea or chest pain so intervention was deferred. He underwent CEA without complication. His CAD has been managed medically. He has known history of PAD with long occlusion of the left SFA at the ostium with reconstitution just above the knee, ABI 0.51 on the left. Right ABI is 1.1. He treated medically for this. He was also found to have an occluded left superior renal artery with significant stenosis involving the left lower renal artery and moderate right renal artery stenosis (2 branches).  I performed angioplasty and stent placement to the left lower renal artery in April without complications. Followup renal artery duplex in August showed patent stent with moderate stenosis in the right renal artery. He describes stable claudication involving the left calf. He did have increased discomfort in the knee joint itself but that seems to be different from his claudication. He denies any lower extremity ulceration. Unfortunately, he continues to smoke.  Past Medical History  Diagnosis Date  . HTN (hypertension)   . HLD (hyperlipidemia)   . Stroke 05/29/2012    11/13 admission to [facility] with right arm weakness and was found to have acute CVA. L MCA infarct was found. Carotid dopplers showed > 70% bilateral ICA stenosis. CTA neck showed severe left ICA stenosis. S/p L  CEA 06/2012 ([facility])  . Tobacco abuse     1 PPD  . Cardiomyopathy     Lexiscan myoview (12/13) with EF 35%, global hypokinesis, no scar or ischemia. Echo (12/13) with EF 40-45%, mild LVH, apical hypokinesis.  [name] CAD (coronary artery disease)     LHC 06/16/12: dLM 20%, pLAD 30%, mLAD 70%, oCFX 40%, pOM2 50-60%, pOM3 40%, pRCA 90%, EF 50% with basal to mid inferior HK. => Med Rx unless angina -> consider PCI of RCA  . Lung nodule     Chest CT 06/22/12:  2 small R lung nodules => needs follow up CT 12/2012  . PAD (peripheral artery disease)     a. ABIs 07/24/12:  R 1.1, L 0.51, flush occlusion of L SFA with reconstitution above the knee;  bilat great toe pressures abnormal at risk for tissue loss on left  . Renal artery stenosis   . Carotid artery occlusion     Past Surgical History  Procedure Laterality Date  . Endarterectomy  06/22/2012    Procedure: ENDARTERECTOMY CAROTID;  Surgeon: Nada Libman, MD;  Location: [facility] OR;  Service: Vascular;  Laterality: Left;  . Patch angioplasty  06/22/2012    Procedure: PATCH ANGIOPLASTY;  Surgeon: Nada Libman, MD;  Location[facility] OR;  Service: Vascular;  Laterality: Left;  Vascu-Guard Patch Angioplasty  . Renal artery stent    . Carotid endarterectomy      Current Outpatient Prescriptions  Medication Sig Dispense Refill  . amLODipine (NORVASC) 10 MG tablet Take 1 tablet (10 mg total)  by mouth at bedtime.  30 tablet  11  . aspirin EC 81 MG tablet Take 81 mg by mouth at bedtime.       [name] atorvastatin (LIPITOR) 40 MG tablet Take 40 mg by mouth daily.      . carvedilol (COREG) 12.5 MG tablet Take 1 tablet (12.5 mg total) by mouth 2 (two) times daily with a meal.  180 tablet  3  . clopidogrel (PLAVIX) 75 MG tablet Take 1 tablet (75 mg total) by mouth at bedtime.  90 tablet  3  . Multiple Vitamin (MULTIVITAMIN) capsule Take 1 capsule by mouth daily.      . pantoprazole (PROTONIX) 40 MG tablet Take 40 mg by mouth daily.      .  valsartan-hydrochlorothiazide (DIOVAN-HCT) 320-12.5 MG per tablet Take 1 tablet by mouth daily.       No current facility-administered medications for this visit.    Allergies  Allergen Reactions  . Penicillins Other (See Comments)    Childhood     History   Social History  . Marital Status: Married    Spouse Name: N/A    Number of Children: 0  . Years of Education: N/A   Occupational History  . 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 Smoking status: Current Every Day Smoker -- 1.00 packs/day for 45 years    Types: Cigarettes  . Smokeless tobacco: Never Used  . Alcohol Use: 21.0 oz/week    35 Cans of beer per week  . Drug Use: No  . Sexual Activity: Not on file   Other Topics Concern  . Not on file   Social History Narrative  . No narrative on file    Family History  Problem Relation Age of Onset  . CAD Neg Hx   . Stroke Brother     Review of Systems:  As stated in the HPI and otherwise negative.   BP 160/79  Pulse 66  Wt 171 lb (77.565 kg)  BMI 26.78 kg/m2  Physical Examination: General: Well developed, well nourished, NAD HEENT: OP clear, mucus membranes moist SKIN: warm, dry. No rashes. Neuro: No focal deficits Musculoskeletal: Muscle strength 5/5 all ext Psychiatric: Mood and affect normal Neck: No JVD, no carotid bruits, no thyromegaly, no lymphadenopathy. Lungs:Clear bilaterally, no wheezes, rhonci, crackles Cardiovascular: Regular rate and rhythm. No murmurs, gallops or rubs. Abdomen:Soft. Bowel sounds present. Non-tender.  Extremities: No lower extremity edema. Pulses are 2 + in the right DP/PT, non-palpable left DP/PT. No ulcerations. Feet are warm to touch.    Assessment and Plan:   1. PAD: The patient continues to have non-lifestyle limiting claudication involving the left calf with flush occlusion of the left SFA and reconstituting distally. The recent knee pain seems to be related to arthritis and not claudication.  Thus, I recommend continuing medical therapy. Unfortunately, he continues to smoke. I had a prolonged discussion with him about the importance of smoking cessation. I will request a followup lower extremity Doppler in January to monitor his lower extremity pressure. Continue dual antiplatelet therapy.  2. renal artery stenosis : He underwent a recent successful left renal artery stenting in April which was patent by duplex in August. I will obtain a renal artery duplex ultrasound in January. 3. Tobacco Abuse:  I strongly advised him to quit smoking.   4. Coronary artery disease with cardiomyopathy: He seems to be stable from a cardiac standpoint.

## 2013-04-03 NOTE — Patient Instructions (Addendum)
Your physician has requested that you have a renal artery duplex. During this test, an ultrasound is used to evaluate blood flow to the kidneys. Allow one hour for this exam. Do not eat after midnight the day before and avoid carbonated beverages. Take your medications as you usually do.  To be done in 07/2013  Your physician has requested that you have a lower extremity arterial duplex. This test is an ultrasound of the arteries in the legs or arms. It looks at arterial blood flow in the legs and arms. Allow one hour for Lower and Upper Arterial scans. There are no restrictions or special instructions. To be done in 07/2013  Your physician wants you to follow-up in: 4 months with Dr. Kirke Corin, 2 weeks after procedures are scheduled. You will receive a reminder letter in the mail two months in advance. If you don't receive a letter, please call our office to schedule the follow-up appointment.  Your physician recommends that you continue on your current medications as directed. Please refer to the Current Medication list given to you today.

## 2013-04-25 ENCOUNTER — Other Ambulatory Visit: Payer: Managed Care, Other (non HMO)

## 2013-04-25 ENCOUNTER — Ambulatory Visit: Payer: Managed Care, Other (non HMO) | Admitting: Cardiology

## 2013-05-06 ENCOUNTER — Encounter: Payer: Self-pay | Admitting: Physician Assistant

## 2013-05-07 ENCOUNTER — Ambulatory Visit: Payer: Managed Care, Other (non HMO) | Admitting: Physician Assistant

## 2013-05-07 ENCOUNTER — Other Ambulatory Visit: Payer: Managed Care, Other (non HMO)

## 2013-05-10 ENCOUNTER — Encounter: Payer: Self-pay | Admitting: Physician Assistant

## 2013-05-15 ENCOUNTER — Other Ambulatory Visit: Payer: Self-pay | Admitting: Cardiology

## 2013-05-17 ENCOUNTER — Other Ambulatory Visit: Payer: Self-pay

## 2013-06-19 ENCOUNTER — Other Ambulatory Visit: Payer: Self-pay | Admitting: Physician Assistant

## 2013-06-25 ENCOUNTER — Ambulatory Visit (INDEPENDENT_AMBULATORY_CARE_PROVIDER_SITE_OTHER)
Admission: RE | Admit: 2013-06-25 | Discharge: 2013-06-25 | Disposition: A | Discharge status: Home / Self Care | Payer: Managed Care, Other (non HMO) | Source: Ambulatory Visit | Attending: Cardiology | Admitting: Cardiology

## 2013-06-25 DIAGNOSIS — I2510 Atherosclerotic heart disease of native coronary artery without angina pectoris: Secondary | ICD-10-CM

## 2013-06-25 DIAGNOSIS — R918 Other nonspecific abnormal finding of lung field: Secondary | ICD-10-CM

## 2013-07-09 ENCOUNTER — Other Ambulatory Visit (HOSPITAL_COMMUNITY): Payer: Managed Care, Other (non HMO)

## 2013-07-09 ENCOUNTER — Ambulatory Visit: Payer: Managed Care, Other (non HMO) | Admitting: Surgery

## 2013-07-10 ENCOUNTER — Encounter: Payer: Self-pay | Admitting: Family

## 2013-07-11 ENCOUNTER — Encounter: Payer: Self-pay | Admitting: Family

## 2013-07-11 ENCOUNTER — Ambulatory Visit (INDEPENDENT_AMBULATORY_CARE_PROVIDER_SITE_OTHER): Payer: Managed Care, Other (non HMO) | Admitting: Family

## 2013-07-11 ENCOUNTER — Encounter: Payer: Self-pay | Admitting: *Deleted

## 2013-07-11 ENCOUNTER — Ambulatory Visit (HOSPITAL_COMMUNITY)
Admission: RE | Admit: 2013-07-11 | Discharge: 2013-07-11 | Disposition: A | Discharge status: Home / Self Care | Payer: Managed Care, Other (non HMO) | Source: Ambulatory Visit | Attending: Family | Admitting: Family

## 2013-07-11 DIAGNOSIS — M79609 Pain in unspecified limb: Secondary | ICD-10-CM

## 2013-07-11 DIAGNOSIS — I6529 Occlusion and stenosis of unspecified carotid artery: Secondary | ICD-10-CM | POA: Insufficient documentation

## 2013-07-11 DIAGNOSIS — I658 Occlusion and stenosis of other precerebral arteries: Secondary | ICD-10-CM | POA: Insufficient documentation

## 2013-07-11 DIAGNOSIS — Z48812 Encounter for surgical aftercare following surgery on the circulatory system: Secondary | ICD-10-CM | POA: Insufficient documentation

## 2013-07-11 NOTE — Patient Instructions (Addendum)
Stroke Prevention Some medical conditions and behaviors are associated with an increased chance of having a stroke. You may prevent a stroke by making healthy choices and managing medical conditions. Reduce your risk of having a stroke by:  Staying physically active. Get at least 30 minutes of activity on most or all days.  Not smoking. It may also be helpful to avoid exposure to secondhand smoke.  Limiting alcohol use. Moderate alcohol use is considered to be:  No more than 2 drinks per day for men.  No more than 1 drink per day for nonpregnant women.  Eating healthy foods.  Include 5 or more servings of fruits and vegetables a day.  Certain diets may be prescribed to address high blood pressure, high cholesterol, diabetes, or obesity.  Managing your cholesterol levels.  A low-saturated fat, low-trans fat, low-cholesterol, and high-fiber diet may control cholesterol levels.  Take any prescribed medicines to control cholesterol as directed by your caregiver.  Managing your diabetes.  A controlled-carbohydrate, controlled-sugar diet is recommended to manage diabetes.  Take any prescribed medicines to control diabetes as directed by your caregiver.  Controlling your high blood pressure (hypertension).  A low-salt (sodium), low-saturated fat, low-trans fat, and low-cholesterol diet is recommended to manage high blood pressure.  Take any prescribed medicines to control hypertension as directed by your caregiver.  Maintaining a healthy weight.  A reduced-calorie, low-sodium, low-saturated fat, low-trans fat, low-cholesterol diet is recommended to manage weight.  Stopping drug abuse.  Avoiding birth control pills.  Talk to your caregiver about the risks of taking birth control pills if you are over 69 years old, smoke, get migraines, or have ever had a blood clot.  Getting evaluated for sleep disorders (sleep apnea).  Talk to your caregiver about getting a sleep evaluation  if you snore a lot or have excessive sleepiness.  Taking medicines as directed by your caregiver.  For some people, aspirin or blood thinners (anticoagulants) are helpful in reducing the risk of forming abnormal blood clots that can lead to stroke. If you have the irregular heart rhythm of atrial fibrillation, you should be on a blood thinner unless there is a good reason you cannot take them.  Understand all your medicine instructions. SEEK IMMEDIATE MEDICAL CARE IF:   You have sudden weakness or numbness of the face, arm, or leg, especially on one side of the body.  You have sudden confusion.  You have trouble speaking (aphasia) or understanding.  You have sudden trouble seeing in one or both eyes.  You have sudden trouble walking.  You have dizziness.  You have a loss of balance or coordination.  You have a sudden, severe headache with no known cause.  You have new chest pain or an irregular heartbeat. Any of these symptoms may represent a serious problem that is an emergency. Do not wait to see if the symptoms will go away. Get medical help right away. Call your local emergency services (911 in [place] Do not drive yourself to the hospital. Document Released: 08/05/2004 Document Revised: 09/20/2011 Document Reviewed: 12/29/2012 [facility] Patient Information 2014 [place].   Smoking Cessation Quitting smoking is important to your health and has many advantages. However, it is not always easy to quit since nicotine is a very addictive drug. Often times, people try 3 times or more before being able to quit. This document explains the best ways for you to prepare to quit smoking. Quitting takes hard work and a lot of effort, but you can do  it. ADVANTAGES OF QUITTING SMOKING  You will live longer, feel better, and live better.  Your body will feel the impact of quitting smoking almost immediately.  Within 20 minutes, blood pressure decreases. Your pulse returns to its  normal level.  After 8 hours, carbon monoxide levels in the blood return to normal. Your oxygen level increases.  After 24 hours, the chance of having a heart attack starts to decrease. Your breath, hair, and body stop smelling like smoke.  After 48 hours, damaged nerve endings begin to recover. Your sense of taste and smell improve.  After 72 hours, the body is virtually free of nicotine. Your bronchial tubes relax and breathing becomes easier.  After 2 to 12 weeks, lungs can hold more air. Exercise becomes easier and circulation improves.  The risk of having a heart attack, stroke, cancer, or lung disease is greatly reduced.  After 1 year, the risk of coronary heart disease is cut in half.  After 5 years, the risk of stroke falls to the same as a nonsmoker.  After 10 years, the risk of lung cancer is cut in half and the risk of other cancers decreases significantly.  After 15 years, the risk of coronary heart disease drops, usually to the level of a nonsmoker.  If you are pregnant, quitting smoking will improve your chances of having a healthy baby.  The people you live with, especially any children, will be healthier.  You will have extra money to spend on things other than cigarettes. QUESTIONS TO THINK ABOUT BEFORE ATTEMPTING TO QUIT You may want to talk about your answers with your caregiver.  Why do you want to quit?  If you tried to quit in the past, what helped and what did not?  What will be the most difficult situations for you after you quit? How will you plan to handle them?  Who can help you through the tough times? Your family? Friends? A caregiver?  What pleasures do you get from smoking? What ways can you still get pleasure if you quit? Here are some questions to ask your caregiver:  How can you help me to be successful at quitting?  What medicine do you think would be best for me and how should I take it?  What should I do if I need more help?  What is  smoking withdrawal like? How can I get information on withdrawal? GET READY  Set a quit date.  Change your environment by getting rid of all cigarettes, ashtrays, matches, and lighters in your home, car, or work. Do not let people smoke in your home.  Review your past attempts to quit. Think about what worked and what did not. GET SUPPORT AND ENCOURAGEMENT You have a better chance of being successful if you have help. You can get support in many ways.  Tell your family, friends, and co-workers that you are going to quit and need their support. Ask them not to smoke around you.  Get individual, group, or telephone counseling and support. Programs are available at [employer] and health centers. Call your local health department for information about programs in your area.  Spiritual beliefs and practices may help some smokers quit.  Download a "quit meter" on your computer to keep track of quit statistics, such as how long you have gone without smoking, cigarettes not smoked, and money saved.  Get a self-help book about quitting smoking and staying off of tobacco. [place] yourself  from urges to smoke. Talk to someone, go for a walk, or occupy your time with a task.  Change your normal routine. Take a different route to work. Drink tea instead of coffee. Eat breakfast in a different place.  Reduce your stress. Take a hot bath, exercise, or read a book.  Plan something enjoyable to do every day. Reward yourself for not smoking.  Explore interactive web-based programs that specialize in helping you quit. GET MEDICINE AND USE IT CORRECTLY Medicines can help you stop smoking and decrease the urge to smoke. Combining medicine with the above behavioral methods and support can greatly increase your chances of successfully quitting smoking.  Nicotine replacement therapy helps deliver nicotine to your body without the negative effects and risks of smoking.  Nicotine replacement therapy includes nicotine gum, lozenges, inhalers, nasal sprays, and skin patches. Some may be available over-the-counter and others require a prescription.  Antidepressant medicine helps people abstain from smoking, but how this works is unknown. This medicine is available by prescription.  Nicotinic receptor partial agonist medicine simulates the effect of nicotine in your brain. This medicine is available by prescription. Ask your caregiver for advice about which medicines to use and how to use them based on your health history. Your caregiver will tell you what side effects to look out for if you choose to be on a medicine or therapy. Carefully read the information on the package. Do not use any other product containing nicotine while using a nicotine replacement product.  RELAPSE OR DIFFICULT SITUATIONS Most relapses occur within the first 3 months after quitting. Do not be discouraged if you start smoking again. Remember, most people try several times before finally quitting. You may have symptoms of withdrawal because your body is used to nicotine. You may crave cigarettes, be irritable, feel very hungry, cough often, get headaches, or have difficulty concentrating. The withdrawal symptoms are only temporary. They are strongest when you first quit, but they will go away within 10 14 days. To reduce the chances of relapse, try to:  Avoid drinking alcohol. Drinking lowers your chances of successfully quitting.  Reduce the amount of caffeine you consume. Once you quit smoking, the amount of caffeine in your body increases and can give you symptoms, such as a rapid heartbeat, sweating, and anxiety.  Avoid smokers because they can make you want to smoke.  Do not let weight gain distract you. Many smokers will gain weight when they quit, usually less than 10 pounds. Eat a healthy diet and stay active. You can always lose the weight gained after you quit.  Find ways to improve  your mood other than smoking. FOR MORE INFORMATION  www.smokefree.gov  Document Released: 06/22/2001 Document Revised: 12/28/2011 Document Reviewed: 10/07/2011 [facility] Patient Information 2014 [place], [place].   Carotid Endarterectomy  A carotid endarterectomy is a surgery to remove a blockage in the carotid arteries. The carotid arteries are the large blood vessels on both sides of the neck that supply blood to the brain. Carotid artery disease, also called carotid artery stenosis, is the narrowing or blockage of one or both carotid arteries. Carotid artery disease is usually caused by atherosclerosis, which is a buildup of fat and plaque in the arteries. Some plaque buildup normally occurs with aging. The plaque may partially or totally block blood flow or cause a clot to form in the carotid arteries.  LET [facility] CARE PROVIDER KNOW ABOUT:  Any allergies you have.   All medicines you are taking, including  vitamins, herbs, eye drops, creams, and over-the-counter medicines.   Use of steroids (by mouth or creams).   Previous problems you or members of your family have had with the use of anesthetics.   Any blood disorders you have.   Previous surgeries you have had.   Medical conditions you have, including diabetes and kidney problems.   Possibility of pregnancy, if this applies.  RISKS AND COMPLICATIONS Generally, carotid endarterectomy is a safe procedure. However, as with any procedure, complications can occur. Possible complications include:  Bleeding.   Infection.   Transient ischemic attacks (TIA). A TIA results from poor blood flow to the brain, but there is no permanent loss of brain function.   Stroke.   Heart attack (myocardial infarction).   High blood pressure (hypertension).   Injury to nerves near the carotid arteries.  BEFORE THE PROCEDURE   Ask your health care provider about changing or stopping your regular medicines.  Do not eat or  drink anything for at least 8 hours before the surgery. Ask your health care provider if it is okay to take any needed medicines with a sip of water.   Do not smoke for as long as possible before the surgery. Smoking will increase the chance of a healing problem after surgery.   You may need to have blood tests, a test to check heart rhythm (electrocardiography), or a test to check blood flow (angiography) done before the surgery.  PROCEDURE  You will be given medicine to make you sleep through the procedure (general anesthetic). After you receive the anesthetic, the surgeon will make a small cut (incision) in your neck to expose the carotid artery. A tube may be inserted into the carotid artery above and below the blockage. This tube will temporarily allow blood to flow around the blockage during the surgery. An incision is made in the carotid artery at the location of the blockage, and the blockage is removed. In some cases, a section of the carotid artery may be removed and a graft patch used to repair the artery. The carotid artery is closed with stitches (sutures). If a tube was inserted into the artery to allow blood flow around the blockage during surgery, the tube will be removed. With the tube removal, blood flow to the brain is restored through the carotid artery. The incision in the neck is then closed with sutures.  AFTER THE PROCEDURE  You will likely stay in the hospital for 1 to 3 days after the surgery. You may have some pain or an ache in your neck for a couple weeks. This is normal.  Document Released: 02/28/2013 Document Reviewed: 11/29/2012 [facility] Patient Information 2014 [place], [place].

## 2013-07-11 NOTE — Progress Notes (Signed)
Established Carotid Patient  History of Present Illness  Darrell Willis is a 67 y.o. male patient of Dr. Myra Gianotti who is status post left carotid endarterectomy with bovine pericardial patch angioplasty on 06/22/2012. Prior to his operation the patient had a left brain stroke. Operative findings included 95% stenosis. He continues to take a statin for his hypertension. Unfortunately, he continues to smoke.  Will pay close attention to the right ICA as his stenosis appears to be increasing. He is scheduled for a carotid ultrasound in 6 months.  The patient has 2 areas of concern on a chest CT on the right side. It appears that he is scheduled for a scan in December, wife states this was done on Dec. 15 at [place], results not available in EPIC. His stroke on 05/24/12 manifested as a couple of episodes of his right hand contracting, denies right leg weakness, denies monocular blindness, denies headache, denies aphasia from this stroke. States he has not had any further stroke or TIA symptoms since November, 2013. Wife states blood clot was found in his left leg by Dr. Samuel Germany recently and has been taking Elequis for about a month for this. He has claudication symptoms in left calf after walking over 800 feet or more, denies claudication symptoms in left thigh or right leg; denies non-healing wounds He walks a great deal on his job, he denies any chest pain.  Pt Diabetic: No Pt smoker: smoker  (1 ppd x 50 yrs)   Past Medical History  Diagnosis Date  . HTN (hypertension)   . HLD (hyperlipidemia)   . Stroke 05/29/2012    11/13 admission to [facility] with right arm weakness and was found to have acute CVA. L MCA infarct was found. Carotid dopplers showed > 70% bilateral ICA stenosis. CTA neck showed severe left ICA stenosis. S/p L CEA 06/2012 ([facility])  . Tobacco abuse     1 PPD  . Cardiomyopathy     Lexiscan myoview (12/13) with EF 35%, global hypokinesis, no scar or ischemia. Echo (12/13)  with EF 40-45%, mild LVH, apical hypokinesis.  [name] CAD (coronary artery disease)     LHC 06/16/12: dLM 20%, pLAD 30%, mLAD 70%, oCFX 40%, pOM2 50-60%, pOM3 40%, pRCA 90%, EF 50% with basal to mid inferior HK. => Med Rx unless angina -> consider PCI of RCA  . Lung nodule     Chest CT 06/22/12:  2 small R lung nodules => needs follow up CT 12/2012  . PAD (peripheral artery disease)     a. ABIs 07/24/12:  R 1.1, L 0.51, flush occlusion of L SFA with reconstitution above the knee;  bilat great toe pressures abnormal at risk for tissue loss on left  . Renal artery stenosis   . Carotid artery occlusion     Social History History  Substance Use Topics  . Smoking status: Current Every Day Smoker -- 1.00 packs/day for 45 years    Types: Cigarettes  . Smokeless tobacco: Never Used  . Alcohol Use: 21.0 oz/week    35 Cans of beer per week    Family History Family History  Problem Relation Age of Onset  . CAD Neg Hx   . Stroke Brother     Surgical History Past Surgical History  Procedure Laterality Date  . Endarterectomy  06/22/2012    Procedure: ENDARTERECTOMY CAROTID;  Surgeon: Nada Libman, MD;  Location: [facility] OR;  Service: Vascular;  Laterality: Left;  . Patch angioplasty  06/22/2012    Procedure:  PATCH ANGIOPLASTY;  Surgeon: Nada Libman, MD;  Location: [facility] OR;  Service: Vascular;  Laterality: Left;  Vascu-Guard Patch Angioplasty  . Renal artery stent    . Carotid endarterectomy      Allergies  Allergen Reactions  . Penicillins Other (See Comments)    Childhood     Current Outpatient Prescriptions  Medication Sig Dispense Refill  . amLODipine (NORVASC) 10 MG tablet TAKE 1 TABLET BY MOUTH EVERY NIGHT AT BEDTIME  30 tablet  0  . aspirin EC 81 MG tablet Take 81 mg by mouth at bedtime.       [name] atorvastatin (LIPITOR) 40 MG tablet Take 40 mg by mouth daily.      [name] atorvastatin (LIPITOR) 40 MG tablet TAKE 1 TABLET BY MOUTH EVERY DAY  30 tablet  0  . carvedilol (COREG) 12.5 MG tablet  Take 1 tablet (12.5 mg total) by mouth 2 (two) times daily with a meal.  180 tablet  3  . clopidogrel (PLAVIX) 75 MG tablet Take 1 tablet (75 mg total) by mouth at bedtime.  90 tablet  3  . Multiple Vitamin (MULTIVITAMIN) capsule Take 1 capsule by mouth daily.      . pantoprazole (PROTONIX) 40 MG tablet Take 40 mg by mouth daily.      . valsartan-hydrochlorothiazide (DIOVAN-HCT) 320-12.5 MG per tablet Take 1 tablet by mouth daily.       No current facility-administered medications for this visit.    Review of Systems : See HPI for pertinent positives and negatives.  Physical Examination  Filed Vitals:   07/11/13 1018  BP: 173/89  Pulse: 73  Resp: 16   Filed Weights   07/11/13 1018  Weight: 162 lb (73.483 kg)   Body mass index is 25.37 kg/(m^2).  General: WDWN male in NAD, strong odor of cigarette smoke GAIT: normal Eyes: PERRLA Pulmonary:  CTAB, Negative  Rales, Negative rhonchi, & Negative wheezing. Breath sounds diminished in all fields.  Cardiac: regular Rhythm ,  Negative Murmurs.  VASCULAR EXAM Carotid Bruits Left Right   Negative Negative    Aorta is not palpable. Radial pulses are 2+ palpable and equal.                                                                                                                            LE Pulses LEFT RIGHT       FEMORAL   palpable   palpable        POPLITEAL  not palpable   not palpable       POSTERIOR TIBIAL  not palpable    palpable        DORSALIS PEDIS      ANTERIOR TIBIAL not palpable  not palpable     Gastrointestinal: soft, nontender, BS WNL, no r/g,  negative masses.  Musculoskeletal: Negative muscle atrophy/wasting. M/S 5/5 throughout, Extremities without ischemic changes.  Neurologic: A&O X 3; Appropriate Affect ; SENSATION ;normal;  Speech is  normal CN 2-12 intact except slight uvula and tongue deviation to the right, Pain and light touch intact in extremities, Motor exam as listed  above.   Non-Invasive Vascular Imaging CAROTID DUPLEX 07/11/2013   Right ICA: 80 - 99 % stenosis. Left ICA: <40% restenosis of CEA site.  Previous carotid studies demonstrated: RICA 60 - 79 % stenosis, LICA <40% stenosis.  These findings are Worse from previous exam.  Assessment: Darrell Willis is a 67 y.o. male who presents with asymptomatic severe Right ICA stenosis and minimal left ICA restenosis post CEA. The  ICA stenosis is  Worse from previous exam.  Plan: Discussed the Duplex and exam findings with Dr. Edilia Boickson, follow-up with Dr. Myra GianottiBr[facility]ham on Monday, January 5, to discuss right CEA.   I discussed in depth with the patient the nature of atherosclerosis, and emphasized the importance of maximal medical management including strict control of blood pressure, blood glucose, and lipid levels, obtaining regular exercise, and cessation of smoking.  The patient is aware that without maximal medical management the underlying atherosclerotic disease process will progress, limiting the benefit of any interventions.   The patient was counseled re smoking cessation.  The patient was given information about stroke prevention and what symptoms should prompt the patient to seek immediate medical care. Thank you for allowing us to participate in this patient's care.  Charisse MarchSuzanne Nickel, RN, MSN, FNP-C [facility] Office: [phone]  Clinic Physician: Edilia BoDickson  07/11/2013 9:23 AM

## 2013-07-13 ENCOUNTER — Encounter: Payer: Self-pay | Admitting: Surgery

## 2013-07-16 ENCOUNTER — Encounter: Payer: Self-pay | Admitting: Surgery

## 2013-07-16 ENCOUNTER — Ambulatory Visit (INDEPENDENT_AMBULATORY_CARE_PROVIDER_SITE_OTHER): Payer: Medicare Other | Admitting: Surgery

## 2013-07-16 DIAGNOSIS — I6529 Occlusion and stenosis of unspecified carotid artery: Secondary | ICD-10-CM

## 2013-07-16 DIAGNOSIS — M79609 Pain in unspecified limb: Secondary | ICD-10-CM

## 2013-07-16 NOTE — Progress Notes (Signed)
Patient name: Darrell Willis MRN: [id number] DOB: [birth year] Sex: male     Chief Complaint  Patient presents with  . Re-evaluation    discuss R CEA - c/o L LE pain when walking X 5 years    HISTORY OF PRESENT ILLNESS: The patient is back today for followup.  He is status post left carotid endarterectomy on 06/22/2012.  This was done following a stroke.  His postoperative course was uncomplicated.  He was recently evaluated with ultrasound and found to have progressive stenosis on the right side, now greater than 80%.  He is asymptomatic.  Specifically he denies numbness or weakness in either extremity.  He denies slurred speech.  He denies amaurosis fugax.  The patient has no residual defects from his original stroke.  He is back working full-time.  The patient has recently undergone stenting of his left renal artery.  Approximately one month ago he was diagnosed with a DVT, for which he is on anticoagulation now.  Past Medical History  Diagnosis Date  . HTN (hypertension)   . HLD (hyperlipidemia)   . Stroke 05/29/2012    11/13 admission to [facility] with right arm weakness and was found to have acute CVA. L MCA infarct was found. Carotid dopplers showed > 70% bilateral ICA stenosis. CTA neck showed severe left ICA stenosis. S/p L CEA 06/2012 (Brabham)  . Tobacco abuse     1 PPD  . Cardiomyopathy     Lexiscan myoview (12/13) with EF 35%, global hypokinesis, no scar or ischemia. Echo (12/13) with EF 40-45%, mild LVH, apical hypokinesis.  [name] CAD (coronary artery disease)     LHC 06/16/12: dLM 20%, pLAD 30%, mLAD 70%, oCFX 40%, pOM2 50-60%, pOM3 40%, pRCA 90%, EF 50% with basal to mid inferior HK. => Med Rx unless angina -> consider PCI of RCA  . Lung nodule     Chest CT 06/22/12:  2 small R lung nodules => needs follow up CT 12/2012  . PAD (peripheral artery disease)     a. ABIs 07/24/12:  R 1.1, L 0.51, flush occlusion of L SFA with reconstitution above the knee;  bilat great toe  pressures abnormal at risk for tissue loss on left  . Renal artery stenosis   . Carotid artery occlusion   . DVT (deep venous thrombosis)   . Arthritis 2014    Gout    Past Surgical History  Procedure Laterality Date  . Endarterectomy  06/22/2012    Procedure: ENDARTERECTOMY CAROTID;  Surgeon: Nada Libman, MD;  Location: [facility] OR;  Service: Vascular;  Laterality: Left;  . Patch angioplasty  06/22/2012    Procedure: PATCH ANGIOPLASTY;  Surgeon: Nada Libman, MD;  Location: [facility] OR;  Service: Vascular;  Laterality: Left;  Vascu-Guard Patch Angioplasty  . Renal artery stent Left 11-08-12  . Carotid endarterectomy Left 06-22-12    cea    History   Social History  . Marital Status: Married    Spouse Name: N/A    Number of Children: 0  . Years of Education: N/A   Occupational History  . 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 Smoking status: Current Every Day Smoker -- 1.00 packs/day for 45 years    Types: Cigarettes  . Smokeless tobacco: Never Used  . Alcohol Use: 21.0 oz/week    35 Cans of beer per week  . Drug Use: No  . Sexual Activity: Not on file   Other Topics  Concern  . Not on file   Social History Narrative  . No narrative on file    Family History  Problem Relation Age of Onset  . CAD Neg Hx   . Stroke Brother     Allergies as of 07/16/2013 - Review Complete 07/16/2013  Allergen Reaction Noted  . Penicillins Other (See Comments) 06/05/2012    Current Outpatient Prescriptions on File Prior to Visit  Medication Sig Dispense Refill  . amLODipine (NORVASC) 10 MG tablet TAKE 1 TABLET BY MOUTH EVERY NIGHT AT BEDTIME  30 tablet  0  . apixaban (ELIQUIS) 5 MG TABS tablet Take 5 mg by mouth 2 (two) times daily.      [name]. aspirin EC 81 MG tablet Take 81 mg by mouth at bedtime.       [name]. atorvastatin (LIPITOR) 40 MG tablet Take 40 mg by mouth daily.      [name]. atorvastatin (LIPITOR) 40 MG tablet TAKE 1 TABLET BY MOUTH EVERY DAY  30 tablet  0  .  carvedilol (COREG) 12.5 MG tablet Take 1 tablet (12.5 mg total) by mouth 2 (two) times daily with a meal.  180 tablet  3  . clopidogrel (PLAVIX) 75 MG tablet Take 1 tablet (75 mg total) by mouth at bedtime.  90 tablet  3  . colchicine (COLCRYS) 0.6 MG tablet Take 0.6 mg by mouth daily.      . cyclobenzaprine (FLEXERIL) 10 MG tablet Take 10 mg by mouth daily.      . Multiple Vitamin (MULTIVITAMIN) capsule Take 1 capsule by mouth daily.      . pantoprazole (PROTONIX) 40 MG tablet Take 40 mg by mouth daily.      . valsartan-hydrochlorothiazide (DIOVAN-HCT) 320-12.5 MG per tablet Take 1 tablet by mouth daily.       No current facility-administered medications on file prior to visit.     REVIEW OF SYSTEMS: Please see history of present illness, otherwise all systems negative  PHYSICAL EXAMINATION:   Vital signs are BP 159/81  Pulse 78  Ht 5\' 7"  (1.702 m)  Wt 160 lb 14.4 oz (72.984 kg)  BMI 25.19 kg/m2  SpO2 100% General: The patient appears their stated age. HEENT:  No gross abnormalities Pulmonary:  Non labored breathing Abdomen: Soft and non-tender Musculoskeletal: There are no major deformities. Neurologic: No focal weakness or paresthesias are detected, Skin: There are no ulcer or rashes noted. Psychiatric: The patient has normal affect. Cardiovascular: There is a regular rate and rhythm without significant murmur appreciated.   Diagnostic Studies I have reviewed his most recent carotid ultrasound which shows greater than 80% right carotid stenosis and less than 40% left carotid stenosis.  Assessment: Asymptomatic right carotid stenosis Plan: I discussed the ultrasound findings with the patient.  I have recommended proceeding with right carotid endarterectomy for stroke prevention.  The risks and benefits of the procedure were discussed with the patient and his wife, including the risk of stroke, nerve injury, and bleeding.  The patient has recently had a deep vein thrombosis  and is on anticoagulation.  For that reason L. delay his operation until the end of the month.  He will need to be off of his anticoagulation prior to his operation.  This is been scheduled for Thursday, January 29  V. Charlena CrossWells Brabham IV, M.D. [facility] [place] Office: [phone] Pager:  [phone]

## 2013-07-18 ENCOUNTER — Encounter: Payer: Self-pay | Admitting: *Deleted

## 2013-07-19 ENCOUNTER — Other Ambulatory Visit: Payer: Self-pay | Admitting: Cardiology

## 2013-07-25 ENCOUNTER — Encounter (HOSPITAL_COMMUNITY): Payer: Self-pay | Admitting: Pharmacy Technician

## 2013-07-30 NOTE — Pre-Procedure Instructions (Addendum)
Darrell Willis  07/30/2013   Your procedure is scheduled on:  Thursday, August 09, 2013 @ 7:30 AM  Report to [facility] Short Stay (use Main Entrance "A'') at 5:30 AM.  Call this number if you have problems the morning of surgery: [phone]   Remember:   Do not eat food or drink liquids after midnight.   Take these medicines the morning of surgery with A SIP OF WATER: carvedilol (COREG) 12.5 MG tablet colchicine (COLCRYS) 0.6 MG tablet, pantoprazole (PROTONIX) 40 MG tablet Hold apixaban (ELIQUIS) 5 MG TABS tablet for 3 days; last dose 08/05/13   Do not wear jewelry, make-up or nail polish.  Do not wear lotions, powders, or perfumes. You may wear deodorant.  Do not shave 48 hours prior to surgery. Men may shave face and neck.  Do not bring valuables to the hospital.  [facility] is not responsible for any belongings or valuables.               Contacts, dentures or bridgework may not be worn into surgery.  Leave suitcase in the car. After surgery it may be brought to your room.  For patients admitted to the hospital, discharge time is determined by your  treatment team.               Patients discharged the day of surgery will not be allowed to drive home.  Name and phone number of your driver:   Special Instructions: Shower using CHG 2 nights before surgery and the night before surgery.  If you shower the day of surgery use CHG.  Use special wash - you have one bottle of CHG for all showers.  You should use approximately 1/3 of the bottle for each shower.   Please read over the following fact sheets that you were given: Pain Booklet, Coughing and Deep Breathing, Blood Transfusion Information, MRSA Information and Surgical Site Infection Prevention

## 2013-07-31 ENCOUNTER — Encounter (HOSPITAL_COMMUNITY)
Admission: RE | Admit: 2013-07-31 | Discharge: 2013-07-31 | Disposition: A | Discharge status: Home / Self Care | Payer: Medicare Other | Source: Ambulatory Visit | Attending: Surgery | Admitting: Surgery

## 2013-07-31 ENCOUNTER — Encounter (HOSPITAL_COMMUNITY): Payer: Self-pay

## 2013-07-31 DIAGNOSIS — Z01811 Encounter for preprocedural respiratory examination: Secondary | ICD-10-CM | POA: Insufficient documentation

## 2013-07-31 DIAGNOSIS — Z01818 Encounter for other preprocedural examination: Secondary | ICD-10-CM | POA: Insufficient documentation

## 2013-07-31 DIAGNOSIS — Z01812 Encounter for preprocedural laboratory examination: Secondary | ICD-10-CM | POA: Insufficient documentation

## 2013-07-31 HISTORY — DX: Gastro-esophageal reflux disease without esophagitis: K21.9

## 2013-07-31 LAB — COMPREHENSIVE METABOLIC PANEL
ALK PHOS: 153 U/L — AB (ref 39–117)
ALT: 65 U/L — ABNORMAL HIGH (ref 0–53)
AST: 41 U/L — AB (ref 0–37)
Albumin: 3.7 g/dL (ref 3.5–5.2)
BUN: 16 mg/dL (ref 6–23)
CALCIUM: 8.9 mg/dL (ref 8.4–10.5)
CO2: 23 meq/L (ref 19–32)
Chloride: 103 mEq/L (ref 96–112)
Creatinine, Ser: 0.88 mg/dL (ref 0.50–1.35)
GFR, EST NON AFRICAN AMERICAN: 88 mL/min — AB (ref >90)
Glucose, Bld: 99 mg/dL (ref 70–99)
Potassium: 4.7 mEq/L (ref 3.7–5.3)
SODIUM: 140 meq/L (ref 137–147)
TOTAL PROTEIN: 7.1 g/dL (ref 6.0–8.3)
Total Bilirubin: 0.4 mg/dL (ref 0.3–1.2)

## 2013-07-31 LAB — CBC
HCT: 39.8 % (ref 39.0–52.0)
Hemoglobin: 13.5 g/dL (ref 13.0–17.0)
MCH: 30.7 pg (ref 26.0–34.0)
MCHC: 33.9 g/dL (ref 30.0–36.0)
MCV: 90.5 fL (ref 78.0–100.0)
PLATELETS: 206 10*3/uL (ref 150–400)
RBC: 4.4 MIL/uL (ref 4.22–5.81)
RDW: 15.4 % (ref 11.5–15.5)
WBC: 9.5 10*3/uL (ref 4.0–10.5)

## 2013-07-31 LAB — APTT: APTT: 31 s (ref 24–37)

## 2013-07-31 LAB — URINALYSIS, ROUTINE W REFLEX MICROSCOPIC
Bilirubin Urine: NEGATIVE
GLUCOSE, UA: NEGATIVE mg/dL
HGB URINE DIPSTICK: NEGATIVE
Ketones, ur: NEGATIVE mg/dL
Leukocytes, UA: NEGATIVE
Nitrite: NEGATIVE
PH: 6.5 (ref 5.0–8.0)
Protein, ur: NEGATIVE mg/dL
Specific Gravity, Urine: 1.011 (ref 1.005–1.030)
Urobilinogen, UA: 0.2 mg/dL (ref 0.0–1.0)

## 2013-07-31 LAB — PROTIME-INR
INR: 0.98 (ref 0.00–1.49)
Prothrombin Time: 12.8 seconds (ref 11.6–15.2)

## 2013-07-31 LAB — SURGICAL PCR SCREEN
MRSA, PCR: NEGATIVE
Staphylococcus aureus: NEGATIVE

## 2013-07-31 NOTE — Progress Notes (Signed)
07/31/13 0923  OBSTRUCTIVE SLEEP APNEA  Have you ever been diagnosed with sleep apnea through a sleep study? No  Do you snore loudly (loud enough to be heard through closed doors)?  0  Do you often feel tired, fatigued, or sleepy during the daytime? 1  Has anyone observed you stop breathing during your sleep? 0  Do you have, or are you being treated for high blood pressure? 1  BMI more than 35 kg/m2? 0  Age over 66 years old? 1  Neck circumference greater than 40 cm/18 inches? 0  Gender: 1  Obstructive Sleep Apnea Score 4  Score 4 or greater  Results sent to PCP

## 2013-07-31 NOTE — Progress Notes (Addendum)
PCP is Dr Renae Fickle. Cardiologist is Dr Sherlie Ban Echo, stress test, and card cath noted in  Epic from Dec 2013 EKG noted in epic from 04-03-13 Pt has a history of CAD, so the chart will be forward to [place] to review. Pt states that he will not be able to wear the blood bracelet before surgery due to his job, so Type and screen will need to be drawn the morning of surgery.

## 2013-08-01 NOTE — Progress Notes (Signed)
Anesthesia chart review: Patient is a 68 year old male scheduled for right carotid endarterectomy on 08/09/2013 by Dr. Myra Gianotti.   History includes left parietal CVA 05/29/2012 s/p left CEA 06/2012, HTN, HLD, bilateral renal artery stenosis s/p left renal artery stent 11/08/12, LLE DVT ~ 05/2013, PAD with left SFA and right IIA occlusion, smoking, lung nodule (felt benign by f/u CT 06/2013). OSA screening was 4. PCP is Dr. Renae Fickle. Primary cardiologist is listed as Dr. Shirlee Latch with last visit with Dr. [name] on 04/03/13.  He was felt to be stable from a CAD standpoint at that time.  EKG on  04/03/13 showed NSR, moderate voltage criteria for LVH.   Prior to his left CEA he had an abnormal stress test.  Surgery was delayed until he could have a cardiac cath which showed moderate CAD with recommendations to treat medically unless he became symptomatic (see below).  Lexiscan myoview (12/13) with EF 35%, global hypokinesis, no scar or ischemia. Echo (12/13) with EF 40-45%, mild LVH, apical hypokinesis.   LHC 06/16/12: dLM 20%, pLAD 30%, mLAD 70%, oCFX 40%, pOM2 50-60%, pOM3 40%, pRCA 90%, EF 50% with basal to mid inferior HK. => Med Rx unless angina -> consider PCI of RCA.  Echo on 06/14/12 showed: Left ventricle: The cavity size was normal. Wall thickness was increased in a pattern of mild LVH. Systolic function was mildly to moderately reduced. The estimated ejection fraction was in the range of 40% to 45%. There is hypokinesis of the apical myocardium. Doppler parameters are consistent with abnormal left ventricular relaxation (grade 1 diastolic dysfunction).  Carotid duplex on 07/11/13 showed 80-99% RICA stenosis, patent left CEA with < 40% ICA stenosis.  CXR on 07/31/13 showed: Emphysema without acute disease. Tiny pulmonary nodules seen on CT scan are not visible on this exam. Chest CT on 06/25/13 showed: 1. Scattered tiny pulmonary nodules are unchanged from 06/22/2012 and are therefore  considered benign. 2. Extensive 3 vessel coronary artery calcification. 3. Enlarged pulmonary arteries, indicative of pulmonary arterial hypertension.  Preoperative labs noted.   Dr. Myra Gianotti is aware of DVT history which is why he delayed surgery until the end of this month.  I did review cardiac and DVT history with anesthesiologist Dr. Katrinka Blazing.  Patient tolerated CEA since his cath last year.  He was felt stable from a cardiac standpoint at this last visit approximately four months ago. No new chest pain symptoms were documented from his PAT visit.  If no acute changes or new CV symptomology then I would anticipate that he could proceed as planned.  Velna Ochs [facility] Short Stay Center/Anesthesiology Phone [phone] 08/01/2013 5:00 PM

## 2013-08-08 MED ORDER — VANCOMYCIN HCL 10 G IV SOLR
1500.0000 mg | INTRAVENOUS | Status: AC
  Administered 2013-08-09: 1500 mg via INTRAVENOUS
  Filled 2013-08-08: qty 1500

## 2013-08-09 ENCOUNTER — Encounter (HOSPITAL_COMMUNITY): Payer: Medicare Other | Admitting: Vascular Surgery

## 2013-08-09 ENCOUNTER — Encounter (HOSPITAL_COMMUNITY)
Admission: RE | Disposition: A | Discharge status: Home / Self Care | Payer: Self-pay | Source: Ambulatory Visit | Attending: Surgery

## 2013-08-09 ENCOUNTER — Inpatient Hospital Stay (HOSPITAL_COMMUNITY)
Admission: RE | Admit: 2013-08-09 | Discharge: 2013-08-10 | DRG: 038 | Disposition: A | Discharge status: Home / Self Care | Payer: Medicare Other | Source: Ambulatory Visit | Attending: Surgery | Admitting: Surgery

## 2013-08-09 ENCOUNTER — Inpatient Hospital Stay (HOSPITAL_COMMUNITY): Payer: Medicare Other | Admitting: Anesthesiology

## 2013-08-09 ENCOUNTER — Encounter (HOSPITAL_COMMUNITY): Payer: Self-pay | Admitting: *Deleted

## 2013-08-09 DIAGNOSIS — E785 Hyperlipidemia, unspecified: Secondary | ICD-10-CM | POA: Diagnosis present

## 2013-08-09 DIAGNOSIS — I70209 Unspecified atherosclerosis of native arteries of extremities, unspecified extremity: Secondary | ICD-10-CM | POA: Diagnosis present

## 2013-08-09 DIAGNOSIS — I6529 Occlusion and stenosis of unspecified carotid artery: Principal | ICD-10-CM | POA: Diagnosis present

## 2013-08-09 DIAGNOSIS — I2510 Atherosclerotic heart disease of native coronary artery without angina pectoris: Secondary | ICD-10-CM | POA: Diagnosis present

## 2013-08-09 DIAGNOSIS — F17200 Nicotine dependence, unspecified, uncomplicated: Secondary | ICD-10-CM | POA: Diagnosis present

## 2013-08-09 DIAGNOSIS — I428 Other cardiomyopathies: Secondary | ICD-10-CM | POA: Diagnosis present

## 2013-08-09 DIAGNOSIS — I10 Essential (primary) hypertension: Secondary | ICD-10-CM | POA: Diagnosis present

## 2013-08-09 DIAGNOSIS — Z8673 Personal history of transient ischemic attack (TIA), and cerebral infarction without residual deficits: Secondary | ICD-10-CM

## 2013-08-09 DIAGNOSIS — I701 Atherosclerosis of renal artery: Secondary | ICD-10-CM | POA: Diagnosis present

## 2013-08-09 DIAGNOSIS — Z880 Allergy status to penicillin: Secondary | ICD-10-CM

## 2013-08-09 DIAGNOSIS — Z823 Family history of stroke: Secondary | ICD-10-CM

## 2013-08-09 HISTORY — PX: ENDARTERECTOMY: SHX5162

## 2013-08-09 HISTORY — PX: CAROTID ENDARTERECTOMY: SUR193

## 2013-08-09 LAB — CBC
HCT: 33.1 % — ABNORMAL LOW (ref 39.0–52.0)
Hemoglobin: 10.9 g/dL — ABNORMAL LOW (ref 13.0–17.0)
MCH: 30.2 pg (ref 26.0–34.0)
MCHC: 32.9 g/dL (ref 30.0–36.0)
MCV: 91.7 fL (ref 78.0–100.0)
Platelets: 157 10*3/uL (ref 150–400)
RBC: 3.61 MIL/uL — ABNORMAL LOW (ref 4.22–5.81)
RDW: 15.8 % — AB (ref 11.5–15.5)
WBC: 11.7 10*3/uL — AB (ref 4.0–10.5)

## 2013-08-09 LAB — CREATININE, SERUM
CREATININE: 0.91 mg/dL (ref 0.50–1.35)
GFR calc non Af Amer: 87 mL/min — ABNORMAL LOW (ref >90)

## 2013-08-09 LAB — TYPE AND SCREEN
ABO/RH(D): O POS
Antibody Screen: NEGATIVE

## 2013-08-09 SURGERY — ENDARTERECTOMY, CAROTID
Anesthesia: General | Site: Neck | Laterality: Right

## 2013-08-09 MED ORDER — ROCURONIUM BROMIDE 100 MG/10ML IV SOLN
INTRAVENOUS | Status: DC | PRN
  Administered 2013-08-09 (×2): 25 mg via INTRAVENOUS
  Administered 2013-08-09 (×2): 10 mg via INTRAVENOUS

## 2013-08-09 MED ORDER — THROMBIN 20000 UNITS EX SOLR
CUTANEOUS | Status: AC
  Filled 2013-08-09: qty 20000

## 2013-08-09 MED ORDER — HEPARIN SODIUM (PORCINE) 1000 UNIT/ML IJ SOLN
INTRAMUSCULAR | Status: AC
  Filled 2013-08-09: qty 1

## 2013-08-09 MED ORDER — FENTANYL CITRATE 0.05 MG/ML IJ SOLN
INTRAMUSCULAR | Status: AC
  Filled 2013-08-09: qty 5

## 2013-08-09 MED ORDER — OXYCODONE HCL 5 MG/5ML PO SOLN: 5.0000 mg | Freq: Once | ORAL | Status: DC | PRN

## 2013-08-09 MED ORDER — CYCLOBENZAPRINE HCL 10 MG PO TABS
10.0000 mg | ORAL_TABLET | Freq: Every day | ORAL | Status: DC
  Administered 2013-08-09: 10 mg via ORAL
  Filled 2013-08-09 (×2): qty 1

## 2013-08-09 MED ORDER — CARVEDILOL 12.5 MG PO TABS
12.5000 mg | ORAL_TABLET | Freq: Two times a day (BID) | ORAL | Status: DC
  Administered 2013-08-09 – 2013-08-10 (×3): 12.5 mg via ORAL

## 2013-08-09 MED ORDER — GLYCOPYRROLATE 0.2 MG/ML IJ SOLN
INTRAMUSCULAR | Status: DC | PRN
  Administered 2013-08-09: 0.2 mg via INTRAVENOUS
  Administered 2013-08-09: 0.6 mg via INTRAVENOUS

## 2013-08-09 MED ORDER — LIDOCAINE-EPINEPHRINE 1 %-1:100000 IJ SOLN
INTRAMUSCULAR | Status: AC
  Filled 2013-08-09: qty 1

## 2013-08-09 MED ORDER — POTASSIUM CHLORIDE CRYS ER 20 MEQ PO TBCR: 20.0000 meq | EXTENDED_RELEASE_TABLET | Freq: Every day | ORAL | Status: DC | PRN

## 2013-08-09 MED ORDER — METOPROLOL TARTRATE 1 MG/ML IV SOLN: 2.0000 mg | INTRAVENOUS | Status: DC | PRN

## 2013-08-09 MED ORDER — APIXABAN 5 MG PO TABS
5.0000 mg | ORAL_TABLET | Freq: Two times a day (BID) | ORAL | Status: DC
  Filled 2013-08-09 (×2): qty 1

## 2013-08-09 MED ORDER — GLYCOPYRROLATE 0.2 MG/ML IJ SOLN
INTRAMUSCULAR | Status: AC
  Filled 2013-08-09: qty 1

## 2013-08-09 MED ORDER — NEOSTIGMINE METHYLSULFATE 1 MG/ML IJ SOLN
INTRAMUSCULAR | Status: DC | PRN
  Administered 2013-08-09: 4 mg via INTRAVENOUS

## 2013-08-09 MED ORDER — PHENYLEPHRINE HCL 10 MG/ML IJ SOLN
INTRAMUSCULAR | Status: AC
  Filled 2013-08-09: qty 1

## 2013-08-09 MED ORDER — PHENOL 1.4 % MT LIQD: 1.0000 | OROMUCOSAL | Status: DC | PRN

## 2013-08-09 MED ORDER — PROTAMINE SULFATE 10 MG/ML IV SOLN
INTRAVENOUS | Status: DC | PRN
  Administered 2013-08-09: 50 mg via INTRAVENOUS

## 2013-08-09 MED ORDER — ASPIRIN EC 81 MG PO TBEC
81.0000 mg | DELAYED_RELEASE_TABLET | Freq: Every day | ORAL | Status: DC
  Administered 2013-08-09: 81 mg via ORAL
  Filled 2013-08-09 (×2): qty 1

## 2013-08-09 MED ORDER — NEOSTIGMINE METHYLSULFATE 1 MG/ML IJ SOLN
INTRAMUSCULAR | Status: AC
  Filled 2013-08-09: qty 10

## 2013-08-09 MED ORDER — HEMOSTATIC AGENTS (NO CHARGE) OPTIME
TOPICAL | Status: DC | PRN
  Administered 2013-08-09: 1 via TOPICAL

## 2013-08-09 MED ORDER — LIDOCAINE HCL (PF) 1 % IJ SOLN
INTRAMUSCULAR | Status: AC
  Filled 2013-08-09: qty 30

## 2013-08-09 MED ORDER — MAGNESIUM SULFATE 40 MG/ML IJ SOLN: 2.0000 g | Freq: Every day | INTRAMUSCULAR | Status: DC | PRN

## 2013-08-09 MED ORDER — SODIUM CHLORIDE 0.9 % IV SOLN
INTRAVENOUS | Status: DC | PRN
  Administered 2013-08-09: 08:00:00 via INTRAVENOUS

## 2013-08-09 MED ORDER — DOCUSATE SODIUM 100 MG PO CAPS: 100.0000 mg | ORAL_CAPSULE | Freq: Every day | ORAL | Status: DC

## 2013-08-09 MED ORDER — ALUM & MAG HYDROXIDE-SIMETH 200-200-20 MG/5ML PO SUSP: 15.0000 mL | ORAL | Status: DC | PRN

## 2013-08-09 MED ORDER — VANCOMYCIN HCL IN DEXTROSE 1-5 GM/200ML-% IV SOLN
1000.0000 mg | Freq: Two times a day (BID) | INTRAVENOUS | Status: AC
  Administered 2013-08-09 – 2013-08-10 (×2): 1000 mg via INTRAVENOUS
  Filled 2013-08-09 (×2): qty 200

## 2013-08-09 MED ORDER — LACTATED RINGERS IV SOLN
INTRAVENOUS | Status: DC | PRN
  Administered 2013-08-09: 07:00:00 via INTRAVENOUS

## 2013-08-09 MED ORDER — STERILE WATER FOR INJECTION IJ SOLN
INTRAMUSCULAR | Status: AC
  Filled 2013-08-09: qty 10

## 2013-08-09 MED ORDER — ACETAMINOPHEN 325 MG PO TABS
325.0000 mg | ORAL_TABLET | ORAL | Status: DC | PRN
  Filled 2013-08-09: qty 2

## 2013-08-09 MED ORDER — ONDANSETRON HCL 4 MG/2ML IJ SOLN
INTRAMUSCULAR | Status: AC
  Filled 2013-08-09: qty 2

## 2013-08-09 MED ORDER — PROPOFOL 10 MG/ML IV BOLUS
INTRAVENOUS | Status: DC | PRN
  Administered 2013-08-09: 10 mg via INTRAVENOUS
  Administered 2013-08-09: 20 mg via INTRAVENOUS
  Administered 2013-08-09: 75 mg via INTRAVENOUS
  Administered 2013-08-09: 20 mg via INTRAVENOUS
  Administered 2013-08-09: 75 mg via INTRAVENOUS

## 2013-08-09 MED ORDER — ONDANSETRON HCL 4 MG/2ML IJ SOLN
INTRAMUSCULAR | Status: DC | PRN
  Administered 2013-08-09: 4 mg via INTRAVENOUS

## 2013-08-09 MED ORDER — PHENYLEPHRINE HCL 10 MG/ML IJ SOLN
INTRAMUSCULAR | Status: DC | PRN
  Administered 2013-08-09: 40 ug via INTRAVENOUS
  Administered 2013-08-09 (×2): 80 ug via INTRAVENOUS

## 2013-08-09 MED ORDER — ROCURONIUM BROMIDE 50 MG/5ML IV SOLN
INTRAVENOUS | Status: AC
  Filled 2013-08-09: qty 2

## 2013-08-09 MED ORDER — 0.9 % SODIUM CHLORIDE (POUR BTL) OPTIME
TOPICAL | Status: DC | PRN
  Administered 2013-08-09: 2000 mL

## 2013-08-09 MED ORDER — MORPHINE SULFATE 2 MG/ML IJ SOLN: 2.0000 mg | INTRAMUSCULAR | Status: DC | PRN

## 2013-08-09 MED ORDER — IRBESARTAN 300 MG PO TABS
300.0000 mg | ORAL_TABLET | Freq: Every day | ORAL | Status: DC
  Administered 2013-08-09: 300 mg via ORAL
  Filled 2013-08-09 (×2): qty 1

## 2013-08-09 MED ORDER — GLYCOPYRROLATE 0.2 MG/ML IJ SOLN
INTRAMUSCULAR | Status: AC
  Filled 2013-08-09: qty 2

## 2013-08-09 MED ORDER — CARVEDILOL 12.5 MG PO TABS
ORAL_TABLET | ORAL | Status: AC
  Filled 2013-08-09: qty 1

## 2013-08-09 MED ORDER — SENNOSIDES-DOCUSATE SODIUM 8.6-50 MG PO TABS
1.0000 | ORAL_TABLET | Freq: Every evening | ORAL | Status: DC | PRN
  Filled 2013-08-09: qty 1

## 2013-08-09 MED ORDER — PROPOFOL 10 MG/ML IV BOLUS
INTRAVENOUS | Status: AC
  Filled 2013-08-09: qty 20

## 2013-08-09 MED ORDER — AMLODIPINE BESYLATE 10 MG PO TABS
10.0000 mg | ORAL_TABLET | Freq: Every day | ORAL | Status: DC
  Filled 2013-08-09 (×2): qty 1

## 2013-08-09 MED ORDER — PHENYLEPHRINE 40 MCG/ML (10ML) SYRINGE FOR IV PUSH (FOR BLOOD PRESSURE SUPPORT)
PREFILLED_SYRINGE | INTRAVENOUS | Status: AC
  Filled 2013-08-09: qty 10

## 2013-08-09 MED ORDER — ROCURONIUM BROMIDE 50 MG/5ML IV SOLN
INTRAVENOUS | Status: AC
  Filled 2013-08-09: qty 1

## 2013-08-09 MED ORDER — HYDRALAZINE HCL 20 MG/ML IJ SOLN: 10.0000 mg | INTRAMUSCULAR | Status: DC | PRN

## 2013-08-09 MED ORDER — MULTIVITAMINS PO CAPS: 1.0000 | ORAL_CAPSULE | Freq: Every day | ORAL | Status: DC

## 2013-08-09 MED ORDER — ARTIFICIAL TEARS OP OINT
TOPICAL_OINTMENT | OPHTHALMIC | Status: AC
  Filled 2013-08-09: qty 3.5

## 2013-08-09 MED ORDER — BISACODYL 10 MG RE SUPP: 10.0000 mg | Freq: Every day | RECTAL | Status: DC | PRN

## 2013-08-09 MED ORDER — OXYCODONE HCL 5 MG PO TABS: 5.0000 mg | ORAL_TABLET | Freq: Once | ORAL | Status: DC | PRN

## 2013-08-09 MED ORDER — COLCHICINE 0.6 MG PO TABS
0.6000 mg | ORAL_TABLET | Freq: Two times a day (BID) | ORAL | Status: DC
Start: 2013-08-09 — End: 2013-08-10
  Administered 2013-08-09: 0.6 mg via ORAL
  Filled 2013-08-09 (×3): qty 1

## 2013-08-09 MED ORDER — LIDOCAINE HCL (CARDIAC) 20 MG/ML IV SOLN
INTRAVENOUS | Status: AC
  Filled 2013-08-09: qty 5

## 2013-08-09 MED ORDER — ADULT MULTIVITAMIN W/MINERALS CH
1.0000 | ORAL_TABLET | Freq: Every day | ORAL | Status: DC
  Filled 2013-08-09 (×2): qty 1

## 2013-08-09 MED ORDER — EPHEDRINE SULFATE 50 MG/ML IJ SOLN
INTRAMUSCULAR | Status: AC
  Filled 2013-08-09: qty 1

## 2013-08-09 MED ORDER — SODIUM CHLORIDE 0.9 % IV SOLN
INTRAVENOUS | Status: DC
  Administered 2013-08-09: 18:00:00 via INTRAVENOUS

## 2013-08-09 MED ORDER — SUCCINYLCHOLINE CHLORIDE 20 MG/ML IJ SOLN
INTRAMUSCULAR | Status: AC
  Filled 2013-08-09: qty 1

## 2013-08-09 MED ORDER — SODIUM CHLORIDE 0.9 % IV SOLN: 500.0000 mL | Freq: Once | INTRAVENOUS | Status: AC | PRN

## 2013-08-09 MED ORDER — DOPAMINE-DEXTROSE 3.2-5 MG/ML-% IV SOLN
3.0000 ug/kg/min | INTRAVENOUS | Status: DC
Start: 2013-08-09 — End: 2013-08-10

## 2013-08-09 MED ORDER — ACETAMINOPHEN 650 MG RE SUPP: 325.0000 mg | RECTAL | Status: DC | PRN

## 2013-08-09 MED ORDER — METOCLOPRAMIDE HCL 5 MG/ML IJ SOLN: 10.0000 mg | Freq: Once | INTRAMUSCULAR | Status: DC | PRN

## 2013-08-09 MED ORDER — SODIUM CHLORIDE 0.9 % IV SOLN
10.0000 mg | INTRAVENOUS | Status: DC | PRN
  Administered 2013-08-09: 10 ug/min via INTRAVENOUS

## 2013-08-09 MED ORDER — GUAIFENESIN-DM 100-10 MG/5ML PO SYRP: 15.0000 mL | ORAL_SOLUTION | ORAL | Status: DC | PRN

## 2013-08-09 MED ORDER — CARVEDILOL 12.5 MG PO TABS
12.5000 mg | ORAL_TABLET | Freq: Two times a day (BID) | ORAL | Status: DC
  Filled 2013-08-09 (×2): qty 1

## 2013-08-09 MED ORDER — FENTANYL CITRATE 0.05 MG/ML IJ SOLN: 25.0000 ug | INTRAMUSCULAR | Status: DC | PRN

## 2013-08-09 MED ORDER — OXYCODONE-ACETAMINOPHEN 5-325 MG PO TABS: 1.0000 | ORAL_TABLET | ORAL | Status: DC | PRN

## 2013-08-09 MED ORDER — HEPARIN SODIUM (PORCINE) 1000 UNIT/ML IJ SOLN
INTRAMUSCULAR | Status: DC | PRN
  Administered 2013-08-09: 7000 [IU] via INTRAVENOUS
  Administered 2013-08-09: 1000 [IU] via INTRAVENOUS

## 2013-08-09 MED ORDER — FENTANYL CITRATE 0.05 MG/ML IJ SOLN
INTRAMUSCULAR | Status: DC | PRN
  Administered 2013-08-09 (×6): 50 ug via INTRAVENOUS

## 2013-08-09 MED ORDER — ONDANSETRON HCL 4 MG/2ML IJ SOLN: 4.0000 mg | Freq: Four times a day (QID) | INTRAMUSCULAR | Status: DC | PRN

## 2013-08-09 MED ORDER — SODIUM CHLORIDE 0.9 % IV SOLN: INTRAVENOUS | Status: DC

## 2013-08-09 MED ORDER — METOPROLOL TARTRATE 1 MG/ML IV SOLN
INTRAVENOUS | Status: AC
  Filled 2013-08-09: qty 5

## 2013-08-09 MED ORDER — SODIUM CHLORIDE 0.9 % IR SOLN
Status: DC | PRN
  Administered 2013-08-09: 08:00:00

## 2013-08-09 MED ORDER — ARTIFICIAL TEARS OP OINT
TOPICAL_OINTMENT | OPHTHALMIC | Status: DC | PRN
  Administered 2013-08-09: 1 via OPHTHALMIC

## 2013-08-09 MED ORDER — ATORVASTATIN CALCIUM 80 MG PO TABS
80.0000 mg | ORAL_TABLET | Freq: Every day | ORAL | Status: DC
  Administered 2013-08-09: 80 mg via ORAL
  Filled 2013-08-09 (×2): qty 1

## 2013-08-09 MED ORDER — LABETALOL HCL 5 MG/ML IV SOLN
INTRAVENOUS | Status: AC
  Filled 2013-08-09: qty 4

## 2013-08-09 MED ORDER — PANTOPRAZOLE SODIUM 40 MG PO TBEC
40.0000 mg | DELAYED_RELEASE_TABLET | Freq: Every day | ORAL | Status: DC
  Administered 2013-08-09: 40 mg via ORAL
  Filled 2013-08-09: qty 1

## 2013-08-09 SURGICAL SUPPLY — 54 items
BLADE SURG ROTATE 9660 (MISCELLANEOUS) ×6 IMPLANT
CANISTER SUCTION 2500CC (MISCELLANEOUS) ×3 IMPLANT
CATH ROBINSON RED A/P 18FR (CATHETERS) ×3 IMPLANT
CATH SUCT 10FR WHISTLE TIP (CATHETERS) ×3 IMPLANT
CLIP TI MEDIUM 6 (CLIP) ×3 IMPLANT
CLIP TI WIDE RED SMALL 6 (CLIP) ×6 IMPLANT
COVER SURGICAL LIGHT HANDLE (MISCELLANEOUS) ×3 IMPLANT
CRADLE DONUT ADULT HEAD (MISCELLANEOUS) ×3 IMPLANT
DERMABOND ADVANCED (GAUZE/BANDAGES/DRESSINGS) ×2
DERMABOND ADVANCED .7 DNX12 (GAUZE/BANDAGES/DRESSINGS) ×1 IMPLANT
DRAIN CHANNEL 15F RND FF W/TCR (WOUND CARE) IMPLANT
DRAPE WARM FLUID 44X44 (DRAPE) ×3 IMPLANT
ELECT REM PT RETURN 9FT ADLT (ELECTROSURGICAL) ×3
ELECTRODE REM PT RTRN 9FT ADLT (ELECTROSURGICAL) ×1 IMPLANT
EVACUATOR SILICONE 100CC (DRAIN) IMPLANT
GLOVE BIOGEL PI IND STRL 6.5 (GLOVE) ×1 IMPLANT
GLOVE BIOGEL PI IND STRL 7.0 (GLOVE) ×3 IMPLANT
GLOVE BIOGEL PI IND STRL 7.5 (GLOVE) ×1 IMPLANT
GLOVE BIOGEL PI INDICATOR 6.5 (GLOVE) ×2
GLOVE BIOGEL PI INDICATOR 7.0 (GLOVE) ×6
GLOVE BIOGEL PI INDICATOR 7.5 (GLOVE) ×2
GLOVE ECLIPSE 6.5 STRL STRAW (GLOVE) ×3 IMPLANT
GLOVE SS BIOGEL STRL SZ 7 (GLOVE) ×2 IMPLANT
GLOVE SUPERSENSE BIOGEL SZ 7 (GLOVE) ×4
GLOVE SURG SS PI 7.5 STRL IVOR (GLOVE) ×9 IMPLANT
GOWN STRL REUS W/ TWL LRG LVL3 (GOWN DISPOSABLE) ×3 IMPLANT
GOWN STRL REUS W/ TWL XL LVL3 (GOWN DISPOSABLE) ×2 IMPLANT
GOWN STRL REUS W/TWL LRG LVL3 (GOWN DISPOSABLE) ×6
GOWN STRL REUS W/TWL XL LVL3 (GOWN DISPOSABLE) ×4
HEMOSTAT SNOW SURGICEL 2X4 (HEMOSTASIS) ×3 IMPLANT
INSERT FOGARTY SM (MISCELLANEOUS) IMPLANT
KIT BASIN OR (CUSTOM PROCEDURE TRAY) ×3 IMPLANT
KIT ROOM TURNOVER OR (KITS) ×3 IMPLANT
NS IRRIG 1000ML POUR BTL (IV SOLUTION) ×6 IMPLANT
PACK CAROTID (CUSTOM PROCEDURE TRAY) ×3 IMPLANT
PAD ARMBOARD 7.5X6 YLW CONV (MISCELLANEOUS) ×6 IMPLANT
PATCH VASCULAR VASCU GUARD 1X6 (Vascular Products) ×3 IMPLANT
SHUNT CAROTID BYPASS 10 (VASCULAR PRODUCTS) IMPLANT
SHUNT CAROTID BYPASS 12FRX15.5 (VASCULAR PRODUCTS) IMPLANT
SPONGE INTESTINAL PEANUT (DISPOSABLE) ×3 IMPLANT
SUT ETHILON 3 0 PS 1 (SUTURE) IMPLANT
SUT PROLENE 6 0 BV (SUTURE) ×3 IMPLANT
SUT PROLENE 7 0 BV 1 (SUTURE) IMPLANT
SUT PROLENE 7 0 BV1 MDA (SUTURE) ×3 IMPLANT
SUT SILK 3 0 (SUTURE) ×2
SUT SILK 3 0 TIES 17X18 (SUTURE)
SUT SILK 3-0 18XBRD TIE 12 (SUTURE) ×1 IMPLANT
SUT SILK 3-0 18XBRD TIE BLK (SUTURE) IMPLANT
SUT VIC AB 3-0 SH 27 (SUTURE) ×4
SUT VIC AB 3-0 SH 27X BRD (SUTURE) ×2 IMPLANT
SUT VICRYL 4-0 PS2 18IN ABS (SUTURE) ×3 IMPLANT
TOWEL OR 17X24 6PK STRL BLUE (TOWEL DISPOSABLE) ×3 IMPLANT
TOWEL OR 17X26 10 PK STRL BLUE (TOWEL DISPOSABLE) ×3 IMPLANT
WATER STERILE IRR 1000ML POUR (IV SOLUTION) ×3 IMPLANT

## 2013-08-09 NOTE — Anesthesia Procedure Notes (Addendum)
Procedure Name: Intubation Date/Time: 08/09/2013 7:38 AM Performed by: Fransisca Kaufmann Pre-anesthesia Checklist: Patient identified, Emergency Drugs available, Suction available, Patient being monitored and Timeout performed Patient Re-evaluated:Patient Re-evaluated prior to inductionOxygen Delivery Method: Circle system utilized Preoxygenation: Pre-oxygenation with 100% oxygen Intubation Type: IV induction Ventilation: Mask ventilation without difficulty Grade View: Grade II Tube type: Oral Tube size: 7.5 mm Number of attempts: 1 Airway Equipment and Method: Stylet and Video-laryngoscopy Placement Confirmation: ETT inserted through vocal cords under direct vision,  positive ETCO2 and breath sounds checked- equal and bilateral Secured at: 23 cm Tube secured with: Tape Dental Injury: Teeth and Oropharynx as per pre-operative assessment  Comments: DL x 1 to observe cord movement, equal, induction completed, DL x 1 ETT placement with ease

## 2013-08-09 NOTE — Preoperative (Signed)
Beta Blockers   Reason not to administer Beta Blockers:Not Applicable 

## 2013-08-09 NOTE — Progress Notes (Signed)
Pt admitted from PACU - family at bedside - oriented to unit and routines, safety and pain management discussed, both verbalized understanding

## 2013-08-09 NOTE — Interval H&P Note (Signed)
History and Physical Interval Note:  08/09/2013 6:54 AM  Darrell Willis  has presented today for surgery, with the diagnosis of RIGHT ICA STENOSIS  The various methods of treatment have been discussed with the patient and family. After consideration of risks, benefits and other options for treatment, the patient has consented to  Procedure(s): ENDARTERECTOMY CAROTID (Right) as a surgical intervention .  The patient's history has been reviewed, patient examined, no change in status, stable for surgery.  I have reviewed the patient's chart and labs.  Questions were answered to the patient's satisfaction.     BRABHAM IV, V. WELLS

## 2013-08-09 NOTE — Op Note (Signed)
Patient name: Darrell Willis MRN: [id number] DOB: May 19, 1946 Sex: male  08/09/2013 Pre-operative Diagnosis: Asx   right carotid stenosis Post-operative diagnosis:  Same Surgeon:  Jorge Ny Assistants:   Narda Amber, P.A. Procedure:    right carotid Endarterectomy with bovine pericardial patch angioplasty Anesthesia:  General Blood Loss:  See anesthesia record Specimens:  Carotid Plaque to pathology  Findings:  85 %stenosis; Thrombus:  none  Indications:  68 yo s/p left brain stroke, followed by left carotid endarterectomy.  On surveillance imaging, his right side has now progressed to greater than 80%.  He is asymptomatic.  Procedure:  The patient was identified in the holding area and taken to [facility] OR ROOM 11  The patient was then placed supine on the table.   General endotrachial anesthesia was administered.  His cords were evaluated during intubation and found to be normal. The patient was prepped and draped in the usual sterile fashion.  A time out was called and antibiotics were administered.  The incision was made along the anterior border of the right sternocleidomastoid muscle.  Cautery was used to dissect through the subcutaneous tissue.  The platysma muscle was divided with cautery.  The internal jugular vein was exposed along its anterior medial border.  The common facial vein was exposed and then divided between 2-0 silk ties and metal clips.  The common carotid artery was then circumferentially exposed and encircled with an umbilical tape.  The vagus nerve was identified and protected.  Next sharp dissection was used to expose the external carotid artery and the superior thyroid artery.  The were encircled with a blue vessel loop and a 2-0 silk tie respectively.  Finally, the internal carotid was carefully dissected free.  An umbilical tape was placed around the internal carotid artery distal to the diseased segment.  The hypoglossal nerve was visualized throughout and  protected.  The patient was given systemic heparinization.  A bovine carotid patch was selected and prepared on the back table.  A 10 french shunt was also prepared.  After blood pressure readings were appropriate and the heparin had been given time to circulate, the internal carotid artery was occluded with a baby Gregory clamp.  The external and common carotid arteries were then occluded with vascular clamps and the 2-0 tie tightened on the superior thyroid artery.  A #11 blade was used to make an arteriotomy in the common carotid artery.  This was extended with Potts scissors along the anterior and lateral border of the common and internal carotid artery.  Approximately 85% stenosis was identified.  There was no thrombus identified.  The 10 french shunt was not placed, as there was excellent backbleeding. A kleiner kuntz elevator was used to perform endarterectomy.  An eversion endarterectomy was performed in the external carotid artery.  A good distal endpoint was obtained in the internal carotid artery.  The specimen was removed and sent to pathology.  Heparinized saline was used to irrigate the endarterectomized field.  All potential embolic debris was removed.  Bovine pericardial patch angioplasty was then performed using a running 6-0 Prolene. The common internal and external carotid arteries were all appropriately flushed. The artery was again irrigated with heparin saline.  The anastomosis was then secured. The clamp was first released on the external carotid artery followed by the common carotid artery approximately 30 seconds later, bloodflow was reestablish through the internal carotid artery.  Next, a hand-held  Doppler was used to evaluate the signals in the  common, external, and internal  carotid arteries, all of which had appropriate signals. I then administered  50 mg protamine. The wound was then irrigated.  After hemostasis was achieved, the carotid sheath was reapproximated with 3-0 Vicryl.  The  platysma muscle was reapproximated with running 3-0 Vicryl. The skin  was closed with 4-0 Vicryl. Dermabond was placed on the skin. The  patient was then successfully extubated. His neurologic exam was  similar to his preprocedural exam. The patient was then taken to recovery room  in stable condition. There were no complications.     Disposition:  To PACU in stable condition.  Relevant Operative Details:  Normal anatomy.  A shunt was not placed given the distal extent of the lesion as well as the presence of pulsatile backbleeding.  The endarterectomy went way distal into the internal carotid artery.  The posterior wall of the artery after the endarterectomy was very thin.  I placed 27 no tacking sutures at the distal endpoint.  Nearly the entire length of the bovine pericardial patch was utilized.  Juleen ChinaV. Wells Brabham, M.D. [facility] [place] Office: [phone] Pager:  [phone]

## 2013-08-09 NOTE — Anesthesia Postprocedure Evaluation (Signed)
Anesthesia Post Note  Patient: Darrell Willis  Procedure(s) Performed: Procedure(s) (LRB): RIGHT CAROTID ARTERY ENDARTERECTOMY WITH VASCU-GUARD PATCH ANGIOPLASTY (Right)  Anesthesia type: General  Patient location: PACU  Post pain: Pain level controlled  Post assessment: Patient's Cardiovascular Status Stable  Last Vitals:  Filed Vitals:   08/09/13 1108  BP:   Pulse: 48  Temp:   Resp: 13    Post vital signs: Reviewed and stable  Level of consciousness: alert  Complications: No apparent anesthesia complications

## 2013-08-09 NOTE — H&P (View-Only) (Signed)
Established Carotid Patient  History of Present Illness  Darrell Willis is a 68 y.o. male patient of Dr. Myra Gianotti who is status post left carotid endarterectomy with bovine pericardial patch angioplasty on 06/22/2012. Prior to his operation the patient had a left brain stroke. Operative findings included 95% stenosis. He continues to take a statin for his hypertension. Unfortunately, he continues to smoke.  Will pay close attention to the right ICA as his stenosis appears to be increasing. He is scheduled for a carotid ultrasound in 6 months.  The patient has 2 areas of concern on a chest CT on the right side. It appears that he is scheduled for a scan in December, wife states this was done on Dec. 15 at [place], results not available in EPIC. His stroke on 05/24/12 manifested as a couple of episodes of his right hand contracting, denies right leg weakness, denies monocular blindness, denies headache, denies aphasia from this stroke. States he has not had any further stroke or TIA symptoms since November, 2013. Wife states blood clot was found in his left leg by Dr. Samuel Germany recently and has been taking Elequis for about a month for this. He has claudication symptoms in left calf after walking over 800 feet or more, denies claudication symptoms in left thigh or right leg; denies non-healing wounds He walks a great deal on his job, he denies any chest pain.  Pt Diabetic: No Pt smoker: smoker  (1 ppd x 50 yrs)   Past Medical History  Diagnosis Date  . HTN (hypertension)   . HLD (hyperlipidemia)   . Stroke 05/29/2012    11/13 admission to [facility] with right arm weakness and was found to have acute CVA. L MCA infarct was found. Carotid dopplers showed > 70% bilateral ICA stenosis. CTA neck showed severe left ICA stenosis. S/p L CEA 06/2012 ([facility])  . Tobacco abuse     1 PPD  . Cardiomyopathy     Lexiscan myoview (12/13) with EF 35%, global hypokinesis, no scar or ischemia. Echo (12/13)  with EF 40-45%, mild LVH, apical hypokinesis.  [name] CAD (coronary artery disease)     LHC 06/16/12: dLM 20%, pLAD 30%, mLAD 70%, oCFX 40%, pOM2 50-60%, pOM3 40%, pRCA 90%, EF 50% with basal to mid inferior HK. => Med Rx unless angina -> consider PCI of RCA  . Lung nodule     Chest CT 06/22/12:  2 small R lung nodules => needs follow up CT 12/2012  . PAD (peripheral artery disease)     a. ABIs 07/24/12:  R 1.1, L 0.51, flush occlusion of L SFA with reconstitution above the knee;  bilat great toe pressures abnormal at risk for tissue loss on left  . Renal artery stenosis   . Carotid artery occlusion     Social History History  Substance Use Topics  . Smoking status: Current Every Day Smoker -- 1.00 packs/day for 45 years    Types: Cigarettes  . Smokeless tobacco: Never Used  . Alcohol Use: 21.0 oz/week    35 Cans of beer per week    Family History Family History  Problem Relation Age of Onset  . CAD Neg Hx   . Stroke Brother     Surgical History Past Surgical History  Procedure Laterality Date  . Endarterectomy  06/22/2012    Procedure: ENDARTERECTOMY CAROTID;  Surgeon: Nada Libman, MD;  Location: [facility] OR;  Service: Vascular;  Laterality: Left;  . Patch angioplasty  06/22/2012    Procedure:  PATCH ANGIOPLASTY;  Surgeon: Nada Libman, MD;  Location: [facility] OR;  Service: Vascular;  Laterality: Left;  Vascu-Guard Patch Angioplasty  . Renal artery stent    . Carotid endarterectomy      Allergies  Allergen Reactions  . Penicillins Other (See Comments)    Childhood     Current Outpatient Prescriptions  Medication Sig Dispense Refill  . amLODipine (NORVASC) 10 MG tablet TAKE 1 TABLET BY MOUTH EVERY NIGHT AT BEDTIME  30 tablet  0  . aspirin EC 81 MG tablet Take 81 mg by mouth at bedtime.       [name] atorvastatin (LIPITOR) 40 MG tablet Take 40 mg by mouth daily.      [name] atorvastatin (LIPITOR) 40 MG tablet TAKE 1 TABLET BY MOUTH EVERY DAY  30 tablet  0  . carvedilol (COREG) 12.5 MG tablet  Take 1 tablet (12.5 mg total) by mouth 2 (two) times daily with a meal.  180 tablet  3  . clopidogrel (PLAVIX) 75 MG tablet Take 1 tablet (75 mg total) by mouth at bedtime.  90 tablet  3  . Multiple Vitamin (MULTIVITAMIN) capsule Take 1 capsule by mouth daily.      . pantoprazole (PROTONIX) 40 MG tablet Take 40 mg by mouth daily.      . valsartan-hydrochlorothiazide (DIOVAN-HCT) 320-12.5 MG per tablet Take 1 tablet by mouth daily.       No current facility-administered medications for this visit.    Review of Systems : See HPI for pertinent positives and negatives.  Physical Examination  Filed Vitals:   07/11/13 1018  BP: 173/89  Pulse: 73  Resp: 16   Filed Weights   07/11/13 1018  Weight: 162 lb (73.483 kg)   Body mass index is 25.37 kg/(m^2).  General: WDWN male in NAD, strong odor of cigarette smoke GAIT: normal Eyes: PERRLA Pulmonary:  CTAB, Negative  Rales, Negative rhonchi, & Negative wheezing. Breath sounds diminished in all fields.  Cardiac: regular Rhythm ,  Negative Murmurs.  VASCULAR EXAM Carotid Bruits Left Right   Negative Negative    Aorta is not palpable. Radial pulses are 2+ palpable and equal.                                                                                                                            LE Pulses LEFT RIGHT       FEMORAL   palpable   palpable        POPLITEAL  not palpable   not palpable       POSTERIOR TIBIAL  not palpable    palpable        DORSALIS PEDIS      ANTERIOR TIBIAL not palpable  not palpable     Gastrointestinal: soft, nontender, BS WNL, no r/g,  negative masses.  Musculoskeletal: Negative muscle atrophy/wasting. M/S 5/5 throughout, Extremities without ischemic changes.  Neurologic: A&O X 3; Appropriate Affect ; SENSATION ;normal;  Speech is  normal CN 2-12 intact except slight uvula and tongue deviation to the right, Pain and light touch intact in extremities, Motor exam as listed  above.   Non-Invasive Vascular Imaging CAROTID DUPLEX 07/11/2013   Right ICA: 80 - 99 % stenosis. Left ICA: <40% restenosis of CEA site.  Previous carotid studies demonstrated: RICA 60 - 79 % stenosis, LICA <40% stenosis.  These findings are Worse from previous exam.  Assessment: Westly PamLarry Duhamel is a 68 y.o. male who presents with asymptomatic severe Right ICA stenosis and minimal left ICA restenosis post CEA. The  ICA stenosis is  Worse from previous exam.  Plan: Discussed the Duplex and exam findings with Dr. Edilia Boickson, follow-up with Dr. Myra GianottiBrabham on Monday, January 5, to discuss right CEA.   I discussed in depth with the patient the nature of atherosclerosis, and emphasized the importance of maximal medical management including strict control of blood pressure, blood glucose, and lipid levels, obtaining regular exercise, and cessation of smoking.  The patient is aware that without maximal medical management the underlying atherosclerotic disease process will progress, limiting the benefit of any interventions.   The patient was counseled re smoking cessation.  The patient was given information about stroke prevention and what symptoms should prompt the patient to seek immediate medical care. Thank you for allowing us to participate in this patient's care.  Charisse MarchSuzanne Nickel, RN, MSN, FNP-C [facility] [place] Office: [phone]  Clinic Physician: Edilia BoDickson  07/11/2013 9:23 AM

## 2013-08-09 NOTE — Transfer of Care (Signed)
Immediate Anesthesia Transfer of Care Note  Patient: Darrell Willis  Procedure(s) Performed: Procedure(s): RIGHT CAROTID ARTERY ENDARTERECTOMY WITH VASCU-GUARD PATCH ANGIOPLASTY (Right)  Patient Location: PACU  Anesthesia Type:General  Level of Consciousness: awake, alert , oriented and sedated  Airway & Oxygen Therapy: Patient Spontanous Breathing and Patient connected to nasal cannula oxygen  Post-op Assessment: Report given to PACU RN, Post -op Vital signs reviewed and stable, Patient moving all extremities, Patient moving all extremities X 4 and Patient able to stick tongue midline  Post vital signs: Reviewed and stable  Complications: No apparent anesthesia complications

## 2013-08-09 NOTE — Anesthesia Preprocedure Evaluation (Signed)
Anesthesia Evaluation  Patient identified by MRN, date of birth, ID band Patient awake    Reviewed: Allergy & Precautions, H&P , NPO status , Patient's Chart, lab work & pertinent test results, reviewed documented beta blocker date and time   Airway Mallampati: II TM Distance: >3 FB Neck ROM: full    Dental   Pulmonary Current Smoker,  breath sounds clear to auscultation        Cardiovascular hypertension, Pt. on medications and Pt. on home beta blockers + CAD, + Peripheral Vascular Disease and +CHF Rhythm:regular     Neuro/Psych CVA negative psych ROS   GI/Hepatic Neg liver ROS, GERD-  Medicated and Controlled,  Endo/Other  negative endocrine ROS  Renal/GU Renal disease  negative genitourinary   Musculoskeletal   Abdominal   Peds  Hematology negative hematology ROS (+)   Anesthesia Other Findings See surgeon's H&P   Reproductive/Obstetrics negative OB ROS                           Anesthesia Physical Anesthesia Plan  ASA: III  Anesthesia Plan: General   Post-op Pain Management:    Induction: Intravenous  Airway Management Planned: Oral ETT  Additional Equipment: Arterial line  Intra-op Plan:   Post-operative Plan: Extubation in OR  Informed Consent: I have reviewed the patients History and Physical, chart, labs and discussed the procedure including the risks, benefits and alternatives for the proposed anesthesia with the patient or authorized representative who has indicated his/her understanding and acceptance.   Dental Advisory Given  Plan Discussed with: CRNA and Surgeon  Anesthesia Plan Comments:         Anesthesia Quick Evaluation

## 2013-08-09 NOTE — Progress Notes (Signed)
Pt assisted to chair, VSS, and had runs of VT, MD notified and pt place back in bed, stat EKG unremarkable - Coreg given just prior to event - will continue to monitor

## 2013-08-10 ENCOUNTER — Encounter (HOSPITAL_COMMUNITY): Payer: Self-pay | Admitting: Surgery

## 2013-08-10 LAB — CBC
HEMATOCRIT: 33.8 % — AB (ref 39.0–52.0)
Hemoglobin: 11.1 g/dL — ABNORMAL LOW (ref 13.0–17.0)
MCH: 30.3 pg (ref 26.0–34.0)
MCHC: 32.8 g/dL (ref 30.0–36.0)
MCV: 92.3 fL (ref 78.0–100.0)
Platelets: 148 10*3/uL — ABNORMAL LOW (ref 150–400)
RBC: 3.66 MIL/uL — ABNORMAL LOW (ref 4.22–5.81)
RDW: 16.1 % — AB (ref 11.5–15.5)
WBC: 11 10*3/uL — ABNORMAL HIGH (ref 4.0–10.5)

## 2013-08-10 LAB — BASIC METABOLIC PANEL
BUN: 10 mg/dL (ref 6–23)
CO2: 21 mEq/L (ref 19–32)
CREATININE: 0.91 mg/dL (ref 0.50–1.35)
Calcium: 7.6 mg/dL — ABNORMAL LOW (ref 8.4–10.5)
Chloride: 107 mEq/L (ref 96–112)
GFR calc non Af Amer: 87 mL/min — ABNORMAL LOW (ref >90)
Glucose, Bld: 107 mg/dL — ABNORMAL HIGH (ref 70–99)
Potassium: 4 mEq/L (ref 3.7–5.3)
Sodium: 141 mEq/L (ref 137–147)

## 2013-08-10 MED ORDER — OXYCODONE-ACETAMINOPHEN 5-325 MG PO TABS: 1.0000 | ORAL_TABLET | Freq: Four times a day (QID) | ORAL | Status: AC | PRN

## 2013-08-10 NOTE — Progress Notes (Signed)
Pt discharged per MD order. All discharge instructions reviwed and all questions answered. Surgical site clean, dry, and intact.

## 2013-08-10 NOTE — Progress Notes (Signed)
[facility] [place]  Subjective  - Doing well no new complaints.  Taking PO's well, walked and voided.   Objective 133/53 76 98.9 F (37.2 C) (Oral) 17 91%  Intake/Output Summary (Last 24 hours) at 08/10/13 0723 Last data filed at 08/10/13 0400  Gross per 24 hour  Intake   3590 ml  Output    725 ml  Net   2865 ml    NO facial drop, no tongue deviation. Grip strength 5/5 equal Bil. Palpable radial pulse Incision min. Edema without frank hematoma.  Assessment/Planning: POD #1 right carotid Endarterectomy with bovine pericardial patch angioplasty D/C home   Clinton Gallant [facility] 08/10/2013 7:23 AM --  Laboratory Lab Results:  Recent Labs  08/09/13 1630 08/10/13 0258  WBC 11.7* 11.0*  HGB 10.9* 11.1*  HCT 33.1* 33.8*  PLT 157 148*   BMET  Recent Labs  08/09/13 1630 08/10/13 0258  NA  --  141  K  --  4.0  CL  --  107  CO2  --  21  GLUCOSE  --  107*  BUN  --  10  CREATININE 0.91 0.91  CALCIUM  --  7.6*    COAG Lab Results  Component Value Date   INR 0.98 07/31/2013   INR 0.9 11/03/2012   INR 0.92 08/30/2012   No results found for this basename: PTT

## 2013-08-13 ENCOUNTER — Telehealth: Payer: Self-pay

## 2013-08-13 NOTE — Telephone Encounter (Signed)
Pt's wife called to report intermittent spotting of clear yellow fluid from the right neck incision.  States it weeps on occasion.  Stated the swelling in the right neck has improved.  Denies any opening of incision line.  Denies any redness or warmth of the surrounding tissue.  Advised to continue to monitor, may apply light gauze dressing to area of drainage, and change prn, to keep site clean and dry.  Advised to notify office if s/s of redness, warmth, increased swelling, opening in the incision, fever or chills.  Verb. Understanding.

## 2013-08-14 NOTE — Discharge Summary (Signed)
[facility] Discharge Summary   Patient ID:  Darrell Willis Jacquin MRN: [id number] DOB/AGE: 1946/07/11 68 y.o.  Admit date: 08/09/2013 Discharge date: 08/14/2013 Date of Surgery: 08/09/2013 Surgeon: Surgeon(s): Nada LibmanVance W Brabham, MD  Admission Diagnosis: RIGHT ICA STENOSIS  Discharge Diagnoses:  RIGHT ICA STENOSIS  Secondary Diagnoses: Past Medical History  Diagnosis Date  . HTN (hypertension)   . HLD (hyperlipidemia)   . Stroke 05/29/2012    11/13 admission to [facility] with right arm weakness and was found to have acute CVA. L MCA infarct was found. Carotid dopplers showed > 70% bilateral ICA stenosis. CTA neck showed severe left ICA stenosis. S/p L CEA 06/2012 ([facility])  . Tobacco abuse     1 PPD  . Cardiomyopathy     Lexiscan myoview (12/13) with EF 35%, global hypokinesis, no scar or ischemia. Echo (12/13) with EF 40-45%, mild LVH, apical hypokinesis.  Darrell Willis. CAD (coronary artery disease)     LHC 06/16/12: dLM 20%, pLAD 30%, mLAD 70%, oCFX 40%, pOM2 50-60%, pOM3 40%, pRCA 90%, EF 50% with basal to mid inferior HK. => Med Rx unless angina -> consider PCI of RCA  . Lung nodule     Chest CT 06/22/12:  2 small R lung nodules => needs follow up CT 12/2012  . PAD (peripheral artery disease)     a. ABIs 07/24/12:  R 1.1, L 0.51, flush occlusion of L SFA with reconstitution above the knee;  bilat great toe pressures abnormal at risk for tissue loss on left  . Renal artery stenosis   . Carotid artery occlusion   . DVT (deep venous thrombosis)   . Arthritis 2014    Gout  . GERD (gastroesophageal reflux disease)     Procedure(s): RIGHT CAROTID ARTERY ENDARTERECTOMY WITH VASCU-GUARD PATCH ANGIOPLASTY  Discharged Condition: good  HPI: 68 y/o male with 90% right carotid stenosis.  He has history of a stroke on 05/24/12 manifested as a couple of episodes of his right hand contracting.  He denied any other history of weakness, denies monocular blindness, denies headache,  denies aphasia from this stroke. States he has not had any further stroke or TIA symptoms since November, 2013.  He underwent right carotid Endarterectomy with bovine pericardial patch angioplasty by Dr. Myra GianottiBrabham on 08/09/2013.  No complications.  Taking PO's well, walked and voided.  Discharged home with a follow up visit for 2 weeks in our office with Dr. Myra Gianottibrabham.      [name]  Darrell Willis Turton is a 68 y.o. male is S/P Right Procedure(s): RIGHT CAROTID ARTERY ENDARTERECTOMY WITH VASCU-GUARD PATCH ANGIOPLASTY Extubated: POD # 0 Physical exam: NO facial drop, no tongue deviation.  Grip strength 5/5 equal Bil.  Palpable radial pulse  Incision min. Edema without frank hematoma.     Post-op wounds healing well Pt. Ambulating, voiding and taking PO diet without difficulty. Pt pain controlled with PO pain meds. Labs as below Complications:none  Consults:     Significant Diagnostic Studies: CBC Lab Results  Component Value Date   WBC 11.0* 08/10/2013   HGB 11.1* 08/10/2013   HCT 33.8* 08/10/2013   MCV 92.3 08/10/2013   PLT 148* 08/10/2013    BMET    Component Value Date/Time   NA 141 08/10/2013 0258   K 4.0 08/10/2013 0258   CL 107 08/10/2013 0258   CO2 21 08/10/2013 0258   GLUCOSE 107* 08/10/2013 0258   BUN 10 08/10/2013 0258   CREATININE 0.91 08/10/2013 0258   CALCIUM 7.6* 08/10/2013  0258   GFRNONAA 87* 08/10/2013 0258   GFRAA >90 08/10/2013 0258   COAG Lab Results  Component Value Date   INR 0.98 07/31/2013   INR 0.9 11/03/2012   INR 0.92 08/30/2012     Disposition:  Discharge to :Home Discharge Orders   Future Orders Complete By Expires   Call MD for:  redness, tenderness, or signs of infection (pain, swelling, bleeding, redness, odor or green/yellow discharge around incision site)  As directed    Call MD for:  severe or increased pain, loss or decreased feeling  in affected limb(s)  As directed    Call MD for:  temperature >100.5  As directed    Driving  Restrictions  As directed    Comments:     No driving for 2 weeks   Lifting restrictions  As directed    Comments:     No lifting for 6 weeks   Resume previous diet  As directed        Medication List         amLODipine 10 MG tablet  Commonly known as:  NORVASC  TAKE 1 TABLET BY MOUTH EVERY NIGHT AT BEDTIME     aspirin EC 81 MG tablet  Take 81 mg by mouth at bedtime.     atorvastatin 80 MG tablet  Commonly known as:  LIPITOR  Take 80 mg by mouth daily.     carvedilol 12.5 MG tablet  Commonly known as:  COREG  Take 1 tablet (12.5 mg total) by mouth 2 (two) times daily with a meal.     COLCRYS 0.6 MG tablet  Generic drug:  colchicine  Take 0.6 mg by mouth 2 (two) times daily.     cyclobenzaprine 10 MG tablet  Commonly known as:  FLEXERIL  Take 10 mg by mouth daily.     ELIQUIS 5 MG Tabs tablet  Generic drug:  apixaban  Take 5 mg by mouth 2 (two) times daily.     multivitamin capsule  Take 1 capsule by mouth daily.     oxyCODONE-acetaminophen 5-325 MG per tablet  Commonly known as:  PERCOCET/ROXICET  Take 1-2 tablets by mouth every 6 (six) hours as needed for moderate pain.     pantoprazole 40 MG tablet  Commonly known as:  PROTONIX  Take 40 mg by mouth daily.     valsartan 320 MG tablet  Commonly known as:  DIOVAN  Take 320 mg by mouth daily.       Verbal and written Discharge instructions given to the patient. Wound care per Discharge AVS   Signed: Clinton Gallant [facility] 08/14/2013, 12:48 PM  --- For VQI Registry use --- Instructions: Press F2 to tab through selections.  Delete question if not applicable.   Modified Rankin score at D/C (0-6): Rankin Score=0  IV medication needed for:  1. Hypertension: No 2. Hypotension: No  Post-op Complications: No  1. Post-op CVA or TIA: No  If yes: Event classification (right eye, left eye, right cortical, left cortical, verterobasilar, other):   If yes: Timing of event (intra-op, <6 hrs post-op, >=6 hrs  post-op, unknown):   2. CN injury: No  If yes: CN  injuried   3. Myocardial infarction: No  If yes: Dx by (EKG or clinical, Troponin):   4.  CHF: No  5.  Dysrhythmia (new): No  6. Wound infection: No  7. Reperfusion symptoms: No  8. Return to OR: No  If yes: return to OR for (bleeding, neurologic,  other CEA incision, other):   Discharge medications: Statin use:  Yes ASA use:  Yes Beta blocker use:  Yes ACE-Inhibitor use:  No  for medical reason   P2Y12 Antagonist use: [  ] None, [ ]  Plavix, [ ]  Plasugrel, [ ]  Ticlopinine, [ ]  Ticagrelor, [ ]  Other, [ ]  No for medical reason, [ ]  Non-compliant, [ ]  Not-indicated Anti-coagulant use:  [ ]  None, [ ]  Warfarin, [ ]  Rivaroxaban, [ ]  Dabigatran, [x ] Other, [ ]  No for medical reason, [ ]  Non-compliant, [ ]  Not-indicated Eliquis

## 2013-08-19 NOTE — Discharge Summary (Signed)
I agree with the above  Darrell Willis 

## 2013-08-27 ENCOUNTER — Encounter: Payer: Medicare Other | Admitting: Surgery

## 2013-08-31 ENCOUNTER — Encounter: Payer: Self-pay | Admitting: Surgery

## 2013-09-03 ENCOUNTER — Encounter: Payer: Self-pay | Admitting: Surgery

## 2013-09-03 ENCOUNTER — Ambulatory Visit (INDEPENDENT_AMBULATORY_CARE_PROVIDER_SITE_OTHER): Payer: Self-pay | Admitting: Surgery

## 2013-09-03 VITALS — BP 156/71 | HR 79 | Ht 67.0 in | Wt 163.0 lb

## 2013-09-03 DIAGNOSIS — I6529 Occlusion and stenosis of unspecified carotid artery: Secondary | ICD-10-CM

## 2013-09-03 DIAGNOSIS — Z48812 Encounter for surgical aftercare following surgery on the circulatory system: Secondary | ICD-10-CM

## 2013-09-03 NOTE — Progress Notes (Signed)
The patient comes in for his first postoperative visit, status post right carotid endarterectomy with patch angioplasty on 08/09/2013.  Intraoperative findings included 85% stenosis.  His postoperative course was uncomplicated.  He reports no neurologic deficits today.  The patient is also status post left carotid endarterectomy on 06/22/2012.  This was done the following a left brain stroke.   His incision is healing nicely.  He is neurologically intact.  I have cleared the patient to return to work at his request.  He will followup in 6 months with a carotid ultrasound.  He tells me that he was to transfer care for his renal disease to me and therefore I will schedule a renal artery duplex in 6 months.

## 2013-09-03 NOTE — Addendum Note (Signed)
Addended by: Adria Dill L on: 09/03/2013 04:21 PM   Modules accepted: Orders

## 2013-09-04 ENCOUNTER — Other Ambulatory Visit: Payer: Self-pay | Admitting: Cardiology

## 2013-09-29 ENCOUNTER — Other Ambulatory Visit: Payer: Self-pay | Admitting: Cardiology

## 2013-12-18 ENCOUNTER — Other Ambulatory Visit: Payer: Self-pay | Admitting: Cardiology

## 2013-12-19 ENCOUNTER — Other Ambulatory Visit: Payer: Self-pay

## 2013-12-19 MED ORDER — CARVEDILOL 12.5 MG PO TABS: 12.5000 mg | ORAL_TABLET | Freq: Two times a day (BID) | ORAL | Status: AC

## 2014-02-21 ENCOUNTER — Other Ambulatory Visit: Payer: Self-pay | Admitting: Cardiology

## 2014-03-01 ENCOUNTER — Encounter: Payer: Self-pay | Admitting: Surgery

## 2014-03-04 ENCOUNTER — Encounter: Payer: Self-pay | Admitting: Surgery

## 2014-03-04 ENCOUNTER — Ambulatory Visit (HOSPITAL_COMMUNITY)
Admission: RE | Admit: 2014-03-04 | Discharge: 2014-03-04 | Disposition: A | Discharge status: Home / Self Care | Payer: Medicare Other | Source: Ambulatory Visit | Attending: Surgery | Admitting: Surgery

## 2014-03-04 ENCOUNTER — Ambulatory Visit (INDEPENDENT_AMBULATORY_CARE_PROVIDER_SITE_OTHER): Payer: Medicare Other | Admitting: Surgery

## 2014-03-04 ENCOUNTER — Ambulatory Visit (INDEPENDENT_AMBULATORY_CARE_PROVIDER_SITE_OTHER)
Admission: RE | Admit: 2014-03-04 | Discharge: 2014-03-04 | Disposition: A | Discharge status: Home / Self Care | Payer: Medicare Other | Source: Ambulatory Visit | Attending: Surgery | Admitting: Surgery

## 2014-03-04 VITALS — BP 168/82 | HR 71 | Ht 67.0 in | Wt 163.5 lb

## 2014-03-04 DIAGNOSIS — I6523 Occlusion and stenosis of bilateral carotid arteries: Secondary | ICD-10-CM

## 2014-03-04 DIAGNOSIS — I6529 Occlusion and stenosis of unspecified carotid artery: Secondary | ICD-10-CM | POA: Insufficient documentation

## 2014-03-04 DIAGNOSIS — Z48812 Encounter for surgical aftercare following surgery on the circulatory system: Secondary | ICD-10-CM

## 2014-03-04 DIAGNOSIS — I701 Atherosclerosis of renal artery: Secondary | ICD-10-CM

## 2014-03-04 NOTE — Progress Notes (Signed)
Patient name: Darrell Willis MRN: [id number] DOB: 12/22/1945 Sex: male     Chief Complaint  Patient presents with  . Re-evaluation    6 month f/u carotid/renal    HISTORY OF PRESENT ILLNESS: The patient comes in for his first postoperative visit, status post right carotid endarterectomy with patch angioplasty on 08/09/2013. Intraoperative findings included 85% stenosis. His postoperative course was uncomplicated. He reports no neurologic deficits today.  The patient is also status post left carotid endarterectomy on 06/22/2012. This was done the following a left brain stroke.  The patient reports no neurologic symptoms since last saw him.  The patient requested last time that I follow his renal stents.  He states that his blood pressure remained relatively stable in the 140 range.  His biggest complaint today is that of left leg claudication.  This is worse with walking and affect his left calf.  Resting does help.  He tells me that his symptoms are tolerable currently.   Past Medical History  Diagnosis Date  . HTN (hypertension)   . HLD (hyperlipidemia)   . Stroke 05/29/2012    11/13 admission to [facility] with right arm weakness and was found to have acute CVA. L MCA infarct was found. Carotid dopplers showed > 70% bilateral ICA stenosis. CTA neck showed severe left ICA stenosis. S/p L CEA 06/2012 ([facility])  . Tobacco abuse     1 PPD  . Cardiomyopathy     Lexiscan myoview (12/13) with EF 35%, global hypokinesis, no scar or ischemia. Echo (12/13) with EF 40-45%, mild LVH, apical hypokinesis.  [name] CAD (coronary artery disease)     LHC 06/16/12: dLM 20%, pLAD 30%, mLAD 70%, oCFX 40%, pOM2 50-60%, pOM3 40%, pRCA 90%, EF 50% with basal to mid inferior HK. => Med Rx unless angina -> consider PCI of RCA  . Lung nodule     Chest CT 06/22/12:  2 small R lung nodules => needs follow up CT 12/2012  . PAD (peripheral artery disease)     a. ABIs 07/24/12:  R 1.1, L 0.51, flush occlusion of  L SFA with reconstitution above the knee;  bilat great toe pressures abnormal at risk for tissue loss on left  . Renal artery stenosis   . Carotid artery occlusion   . DVT (deep venous thrombosis)   . Arthritis 2014    Gout  . GERD (gastroesophageal reflux disease)     Past Surgical History  Procedure Laterality Date  . Endarterectomy  06/22/2012    Procedure: ENDARTERECTOMY CAROTID;  Surgeon: Nada Libman, MD;  Location: [facility] OR;  Service: Vascular;  Laterality: Left;  . Patch angioplasty  06/22/2012    Procedure: PATCH ANGIOPLASTY;  Surgeon: Nada Libman, MD;  Location: [facility] OR;  Service: Vascular;  Laterality: Left;  Vascu-Guard Patch Angioplasty  . Renal artery stent Left 11-08-12  . Carotid endarterectomy Left 06-22-12    cea  . Endarterectomy Right 08/09/2013    Procedure: RIGHT CAROTID ARTERY ENDARTERECTOMY WITH VASCU-GUARD PATCH ANGIOPLASTY;  Surgeon: Nada Libman, MD;  Location: [facility] OR;  Service: Vascular;  Laterality: Right;    History   Social History  . Marital Status: Married    Spouse Name: N/A    Number of Children: 0  . Years of Education: N/A   Occupational History  . 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 Smoking status: Current Every Day Smoker -- 1.00 packs/day for 45 years  Types: Cigarettes  . Smokeless tobacco: Never Used  . Alcohol Use: 21.0 oz/week    35 Cans of beer per week  . Drug Use: No  . Sexual Activity: Not on file   Other Topics Concern  . Not on file   Social History Narrative  . No narrative on file    Family History  Problem Relation Age of Onset  . CAD Neg Hx   . Stroke Brother     Allergies as of 03/04/2014 - Review Complete 03/04/2014  Allergen Reaction Noted  . Penicillins Other (See Comments) 06/05/2012    Current Outpatient Prescriptions on File Prior to Visit  Medication Sig Dispense Refill  . allopurinol (ZYLOPRIM) 100 MG tablet Take 1 tablet by mouth daily.      [name] amLODipine  (NORVASC) 10 MG tablet TAKE 1 TABLET BY MOUTH EVERY NIGHT AT BEDTIME  15 tablet  0  . amLODipine (NORVASC) 10 MG tablet TAKE 1 TABLET BY MOUTH EVERY NIGHT AT BEDTIME  15 tablet  0  . aspirin EC 81 MG tablet Take 81 mg by mouth at bedtime.       [name] atorvastatin (LIPITOR) 80 MG tablet Take 80 mg by mouth daily.      . carvedilol (COREG) 12.5 MG tablet Take 1 tablet (12.5 mg total) by mouth 2 (two) times daily with a meal.  180 tablet  0  . cyclobenzaprine (FLEXERIL) 10 MG tablet Take 10 mg by mouth daily.      . Multiple Vitamin (MULTIVITAMIN) capsule Take 1 capsule by mouth daily.      . pantoprazole (PROTONIX) 40 MG tablet Take 40 mg by mouth daily.      . valsartan (DIOVAN) 320 MG tablet Take 320 mg by mouth daily.      [name] apixaban (ELIQUIS) 5 MG TABS tablet Take 5 mg by mouth 2 (two) times daily.      . colchicine (COLCRYS) 0.6 MG tablet Take 0.6 mg by mouth 2 (two) times daily.       [name] oxyCODONE-acetaminophen (PERCOCET/ROXICET) 5-325 MG per tablet Take 1-2 tablets by mouth every 6 (six) hours as needed for moderate pain.  30 tablet  0   No current facility-administered medications on file prior to visit.     REVIEW OF SYSTEMS: Cardiovascular: No chest pain, chest pressure.  Positive for pain in legs with walking, history of DVT, leg swelling, varicose veins Pulmonary: No productive cough, asthma or wheezing.. Neurologic: No weakness, paresthesias, aphasia, or amaurosis. No dizziness. Hematologic: No bleeding problems or clotting disorders. Musculoskeletal: No joint pain or joint swelling. Gastrointestinal: No blood in stool or hematemesis Genitourinary: No dysuria or hematuria. Psychiatric:: No history of major depression. Integumentary: No rashes or ulcers. Constitutional: No fever or chills.  PHYSICAL EXAMINATION:   Vital signs are BP 168/82  Pulse 71  Ht  (1.702 m)  Wt 163 lb 8 oz (74.163 kg)  BMI 25.60 kg/m2  SpO2 100% General: The patient appears their stated  age. HEENT:  No gross abnormalities Pulmonary:  Non labored breathing Musculoskeletal: There are no major deformities. Neurologic: No focal weakness or paresthesias are detected, Skin: There are no ulcer or rashes noted. Psychiatric: The patient has normal affect. Cardiovascular: There is a regular rate and rhythm without significant murmur appreciated.  Left pedal pulse is not palpable.   Diagnostic Studies I have ordered and reviewed ultrasound studies today.  This shows a widely patent bilateral carotid artery system.  Renal Doppler studies were reviewed.  The right renal artery remained stable with a peak velocity of 356.  There are elevated velocities within the left renal artery a 327.  Assessment: #1: Carotid occlusive disease #2: Peripheral vascular disease with claudication #3: Renal artery stenosis Plan: #1: The patient has recovered nicely from bilateral carotid endarterectomy.  He remains neurologically stable.  Ultrasound today showed widely patent bilateral carotid arteries.  He will followup with surveillance ultrasound in one year #2: The patient has a left SFA ostial occlusion.  He is not a candidate for endovascular repair.  His symptoms are not severe enough to warrant surgical bypass #3: The patient renal artery stenosis on the left is progressing and remained stable on the right.  I have recommended that we repeat this in 6 months.  At the velocity profile within the stent on the left has increased, the next step will be angiogram.  Jorge Ny, M.D. [facility] Office: [phone] Pager:  [phone]

## 2014-03-04 NOTE — Addendum Note (Signed)
Addended by: Sharee Pimple on: 03/04/2014 03:41 PM   Modules accepted: Orders

## 2014-03-08 ENCOUNTER — Other Ambulatory Visit: Payer: Self-pay | Admitting: Cardiovascular Disease

## 2014-03-12 ENCOUNTER — Other Ambulatory Visit: Payer: Self-pay | Admitting: Cardiology

## 2014-04-05 ENCOUNTER — Other Ambulatory Visit: Payer: Self-pay | Admitting: Cardiology

## 2014-06-20 ENCOUNTER — Encounter (HOSPITAL_COMMUNITY): Payer: Self-pay | Admitting: Cardiology

## 2014-06-27 ENCOUNTER — Encounter (INDEPENDENT_AMBULATORY_CARE_PROVIDER_SITE_OTHER): Payer: Self-pay

## 2014-07-15 DIAGNOSIS — K210 Gastro-esophageal reflux disease with esophagitis: Secondary | ICD-10-CM | POA: Diagnosis not present

## 2014-07-15 DIAGNOSIS — M109 Gout, unspecified: Secondary | ICD-10-CM | POA: Diagnosis not present

## 2014-07-15 DIAGNOSIS — E784 Other hyperlipidemia: Secondary | ICD-10-CM | POA: Diagnosis not present

## 2014-07-15 DIAGNOSIS — Z125 Encounter for screening for malignant neoplasm of prostate: Secondary | ICD-10-CM | POA: Diagnosis not present

## 2014-07-15 DIAGNOSIS — I6340 Cerebral infarction due to embolism of unspecified cerebral artery: Secondary | ICD-10-CM | POA: Diagnosis not present

## 2014-07-15 DIAGNOSIS — I10 Essential (primary) hypertension: Secondary | ICD-10-CM | POA: Diagnosis not present

## 2014-07-15 DIAGNOSIS — Z6826 Body mass index (BMI) 26.0-26.9, adult: Secondary | ICD-10-CM | POA: Diagnosis not present

## 2014-09-06 ENCOUNTER — Encounter: Payer: Self-pay | Admitting: Family

## 2014-09-09 ENCOUNTER — Other Ambulatory Visit (HOSPITAL_COMMUNITY): Payer: Medicare Other

## 2014-09-09 ENCOUNTER — Ambulatory Visit: Payer: Medicare Other | Admitting: Family

## 2014-09-09 ENCOUNTER — Inpatient Hospital Stay (HOSPITAL_COMMUNITY): Admission: RE | Admit: 2014-09-09 | Payer: Medicare Other | Source: Ambulatory Visit

## 2014-10-14 DIAGNOSIS — K210 Gastro-esophageal reflux disease with esophagitis: Secondary | ICD-10-CM | POA: Diagnosis not present

## 2014-10-14 DIAGNOSIS — I6340 Cerebral infarction due to embolism of unspecified cerebral artery: Secondary | ICD-10-CM | POA: Diagnosis not present

## 2014-10-14 DIAGNOSIS — E784 Other hyperlipidemia: Secondary | ICD-10-CM | POA: Diagnosis not present

## 2014-10-14 DIAGNOSIS — I10 Essential (primary) hypertension: Secondary | ICD-10-CM | POA: Diagnosis not present

## 2014-10-14 DIAGNOSIS — M109 Gout, unspecified: Secondary | ICD-10-CM | POA: Diagnosis not present

## 2014-10-24 ENCOUNTER — Encounter: Payer: Self-pay | Admitting: Family

## 2014-10-25 ENCOUNTER — Encounter: Payer: Self-pay | Admitting: Family

## 2014-10-25 ENCOUNTER — Ambulatory Visit (HOSPITAL_COMMUNITY)
Admission: RE | Admit: 2014-10-25 | Discharge: 2014-10-25 | Disposition: A | Discharge status: Home / Self Care | Payer: Medicare Other | Source: Ambulatory Visit | Attending: Family | Admitting: Family

## 2014-10-25 ENCOUNTER — Ambulatory Visit (INDEPENDENT_AMBULATORY_CARE_PROVIDER_SITE_OTHER): Payer: Medicare Other | Admitting: Family

## 2014-10-25 ENCOUNTER — Ambulatory Visit (INDEPENDENT_AMBULATORY_CARE_PROVIDER_SITE_OTHER)
Admission: RE | Admit: 2014-10-25 | Discharge: 2014-10-25 | Disposition: A | Discharge status: Home / Self Care | Payer: Medicare Other | Source: Ambulatory Visit | Attending: Surgery | Admitting: Surgery

## 2014-10-25 VITALS — BP 128/77 | HR 71 | Resp 16 | Ht 66.0 in | Wt 172.0 lb

## 2014-10-25 DIAGNOSIS — F17200 Nicotine dependence, unspecified, uncomplicated: Secondary | ICD-10-CM

## 2014-10-25 DIAGNOSIS — I701 Atherosclerosis of renal artery: Secondary | ICD-10-CM | POA: Insufficient documentation

## 2014-10-25 DIAGNOSIS — I6523 Occlusion and stenosis of bilateral carotid arteries: Secondary | ICD-10-CM | POA: Insufficient documentation

## 2014-10-25 DIAGNOSIS — Z48812 Encounter for surgical aftercare following surgery on the circulatory system: Secondary | ICD-10-CM

## 2014-10-25 DIAGNOSIS — Z9889 Other specified postprocedural states: Secondary | ICD-10-CM

## 2014-10-25 DIAGNOSIS — I70219 Atherosclerosis of native arteries of extremities with intermittent claudication, unspecified extremity: Secondary | ICD-10-CM

## 2014-10-25 DIAGNOSIS — I739 Peripheral vascular disease, unspecified: Secondary | ICD-10-CM

## 2014-10-25 DIAGNOSIS — Z720 Tobacco use: Secondary | ICD-10-CM

## 2014-10-25 DIAGNOSIS — Z959 Presence of cardiac and vascular implant and graft, unspecified: Secondary | ICD-10-CM

## 2014-10-25 NOTE — Progress Notes (Signed)
Established Carotid/renal artery stenosis/PAD Patient   History of Present Illness  Darrell Willis is a 69 y.o. male patient of Dr. Myra Gianotti who is status post left carotid endarterectomy with bovine pericardial patch angioplasty on 06/22/2012. Prior to his operation the patient had a left brain stroke. Operative findings included 95% stenosis. He continues to take a statin for his hypertension. Unfortunately, he continues to smoke.  He is also status post right carotid endarterectomy with patch angioplasty on 08/09/2013. Intraoperative findings included 85% stenosis. His postoperative course was uncomplicated. He is also s/p left renal artery stent on 10/2012.  The patient last saw Dr. Myra Gianotti in August 2015. At that time the patient had a left SFA ostial occlusion. He was not a candidate for endovascular repair. His symptoms were not severe enough to warrant surgical bypass.  The patient's renal artery stenosis on the left was progressing and remained stable on the right. Dr. Myra Gianotti recommended that we repeat renal Duplex in 6 months. If the velocity profile within the stent on the left has increased, the next step will be angiogram.  Wife states he was diagnosed with gout in his left knee and right foot  He states that his blood pressure remained relatively stable in the 140 range.  His stroke on 05/24/12 manifested as a couple of episodes of his right hand contracting, denies right leg weakness, denies monocular blindness, denies headache, denies aphasia from this stroke. States he has not had any further stroke or TIA symptoms since November, 2013.  Wife states blood clot was found in his left leg by Dr. Samuel Germany, was taking taking Eliquis for about a month for this, stopped in 2015.  He has claudication symptoms in left calf after walking over 800 feet or more, denies claudication symptoms in left thigh or right leg; denies non-healing wounds He used to walk a great deal on his job, he  denies any chest pain; he retired in November 2015.  He denies dizziness, denies tngling, nimbness, pain in either UE.  Pt Diabetic: No Pt smoker: smoker (1 ppd x 50 yrs) Wife states pt has decreased his beer drinking to 1-2 beers/day.  He takes a statin , ASA, and Plavix. Stopped Eliquis in 2015.   Past Medical History  Diagnosis Date  . HTN (hypertension)   . HLD (hyperlipidemia)   . Stroke 05/29/2012    11/13 admission to [facility] with right arm weakness and was found to have acute CVA. L MCA infarct was found. Carotid dopplers showed > 70% bilateral ICA stenosis. CTA neck showed severe left ICA stenosis. S/p L CEA 06/2012 ([facility])  . Tobacco abuse     1 PPD  . Cardiomyopathy     Lexiscan myoview (12/13) with EF 35%, global hypokinesis, no scar or ischemia. Echo (12/13) with EF 40-45%, mild LVH, apical hypokinesis.  [name] CAD (coronary artery disease)     LHC 06/16/12: dLM 20%, pLAD 30%, mLAD 70%, oCFX 40%, pOM2 50-60%, pOM3 40%, pRCA 90%, EF 50% with basal to mid inferior HK. => Med Rx unless angina -> consider PCI of RCA  . Lung nodule     Chest CT 06/22/12:  2 small R lung nodules => needs follow up CT 12/2012  . PAD (peripheral artery disease)     a. ABIs 07/24/12:  R 1.1, L 0.51, flush occlusion of L SFA with reconstitution above the knee;  bilat great toe pressures abnormal at risk for tissue loss on left  . Renal artery stenosis   .  Carotid artery occlusion   . DVT (deep venous thrombosis)   . Arthritis 2014    Gout  . GERD (gastroesophageal reflux disease)   . COPD (chronic obstructive pulmonary disease)     Social History History  Substance Use Topics  . Smoking status: Current Every Day Smoker -- 1.00 packs/day for 45 years    Types: Cigarettes  . Smokeless tobacco: Never Used  . Alcohol Use: 21.0 oz/week    35 Cans of beer per week    Family History Family History  Problem Relation Age of Onset  . CAD Neg Hx   . Stroke Brother     Surgical  History Past Surgical History  Procedure Laterality Date  . Endarterectomy  06/22/2012    Procedure: ENDARTERECTOMY CAROTID;  Surgeon: Nada Libman, MD;  Location: [facility] OR;  Service: Vascular;  Laterality: Left;  . Patch angioplasty  06/22/2012    Procedure: PATCH ANGIOPLASTY;  Surgeon: Nada Libman, MD;  Location: [facility] OR;  Service: Vascular;  Laterality: Left;  Vascu-Guard Patch Angioplasty  . Renal artery stent Left 11-08-12  . Endarterectomy Right 08/09/2013    Procedure: RIGHT CAROTID ARTERY ENDARTERECTOMY WITH VASCU-GUARD PATCH ANGIOPLASTY;  Surgeon: Nada Libman, MD;  Location: [facility] OR;  Service: Vascular;  Laterality: Right;  . Left heart catheterization with coronary angiogram N/A 06/16/2012    Procedure: LEFT HEART CATHETERIZATION WITH CORONARY ANGIOGRAM;  Surgeon: Laurey Morale, MD;  Location: [facility] CATH LAB;  Service: Cardiovascular;  Laterality: N/A;  radial  . Lower extremity angiogram N/A 08/30/2012    Procedure: LOWER EXTREMITY ANGIOGRAM;  Surgeon: Iran Ouch, MD;  Location: [facility] CATH LAB;  Service: Cardiovascular;  Laterality: N/A;  . Abdominal angiogram  08/30/2012    Procedure: ABDOMINAL ANGIOGRAM;  Surgeon: Iran Ouch, MD;  Location: [facility] CATH LAB;  Service: Cardiovascular;;  . Renal angiogram N/A 11/08/2012    Procedure: RENAL ANGIOGRAM;  Surgeon: Iran Ouch, MD;  Location: [facility] CATH LAB;  Service: Cardiovascular;  Laterality: N/A;  . Carotid endarterectomy Left 06-22-12    cea  . Carotid endarterectomy Right 08-09-13    cea    Allergies  Allergen Reactions  . Penicillins Other (See Comments)    Childhood     Current Outpatient Prescriptions  Medication Sig Dispense Refill  . allopurinol (ZYLOPRIM) 100 MG tablet Take 100 mg by mouth 2 (two) times daily.     [name] amLODipine (NORVASC) 10 MG tablet TAKE 1 TABLET BY MOUTH EVERY NIGHT AT BEDTIME 7 tablet 0  . aspirin EC 81 MG tablet Take 81 mg by mouth at bedtime.     [name] atorvastatin (LIPITOR) 80 MG tablet Take  80 mg by mouth daily.    . carvedilol (COREG) 12.5 MG tablet Take 1 tablet (12.5 mg total) by mouth 2 (two) times daily with a meal. 180 tablet 0  . clopidogrel (PLAVIX) 75 MG tablet Take 75 mg by mouth daily.    . colchicine (COLCRYS) 0.6 MG tablet Take 0.6 mg by mouth as needed.     . Multiple Vitamin (MULTIVITAMIN) capsule Take 1 capsule by mouth daily. One A Day 50 +    . pantoprazole (PROTONIX) 40 MG tablet Take 40 mg by mouth daily.    . valsartan (DIOVAN) 320 MG tablet Take 320 mg by mouth daily.    [name] amLODipine (NORVASC) 10 MG tablet TAKE 1 TABLET BY MOUTH EVERY NIGHT AT BEDTIME (Patient not taking: Reported on 10/25/2014) 7 tablet 0  . apixaban (ELIQUIS)  5 MG TABS tablet Take 5 mg by mouth 2 (two) times daily.    . cyclobenzaprine (FLEXERIL) 10 MG tablet Take 10 mg by mouth daily.    [name] oxyCODONE-acetaminophen (PERCOCET/ROXICET) 5-325 MG per tablet Take 1-2 tablets by mouth every 6 (six) hours as needed for moderate pain. (Patient not taking: Reported on 10/25/2014) 30 tablet 0   No current facility-administered medications for this visit.    Review of Systems : See HPI for pertinent positives and negatives.  Physical Examination  Filed Vitals:   10/25/14 1133 10/25/14 1136 10/25/14 1147 10/25/14 1148  BP: 152/84 145/76 141/78 128/77  Pulse: 70 71 71 71  Resp:  16    Height:  5\' 6"  (1.676 m)    Weight:  172 lb (78.019 kg)    SpO2:  96%     Body mass index is 27.77 kg/(m^2).  General: WDWN male in NAD, strong odor of cigarette smoke GAIT: normal Eyes: PERRLA Pulmonary: CTAB, Negative Rales, Negative rhonchi, & positive transient wheezing. Breath sounds diminished in all fields.  Cardiac: regular Rhythm, no detected murmur  VASCULAR EXAM Carotid Bruits Left Right   Negative Negative   Aorta is not palpable. Radial pulses are 2+ palpable and equal.       LE Pulses LEFT RIGHT   FEMORAL  palpable  palpable    POPLITEAL not palpable  not palpable   POSTERIOR TIBIAL not palpable   palpable    DORSALIS PEDIS  ANTERIOR TIBIAL not palpable  faintly palpable     Gastrointestinal: soft, nontender, BS WNL, no r/g, no palpable masses.  Musculoskeletal: Negative muscle atrophy/wasting. M/S 5/5 throughout, Extremities without ischemic changes.  Neurologic: A&O X 3; Appropriate Affect ; SENSATION ;normal;  Speech is normal CN 2-12 intact except slight uvula and tongue deviation to the right, Pain and light touch intact in extremities, Motor exam as listed above.        Non-Invasive Vascular Imaging: (10/25/2014)   CEREBROVASCULAR DUPLEX EVALUATION    INDICATION: Carotid endarterectomy     PREVIOUS INTERVENTION(S): Right carotid endarterectomy on 06/22/12; Left carotid endarterectomy on 08/09/13    DUPLEX EXAM:     RIGHT  LEFT  Peak Systolic Velocities (cm/s) End Diastolic Velocities (cm/s) Plaque LOCATION Peak Systolic Velocities (cm/s) End Diastolic Velocities (cm/s) Plaque  90 16  CCA PROXIMAL 68 11   61 12  CCA MID 59 10 HT  53 10  CCA DISTAL 39 8   88 8  ECA 64 6   44 13  ICA PROXIMAL 46 13   76 23  ICA MID 83 26   83 25  ICA DISTAL 45 16     Not Calculated ICA / CCA Ratio (PSV) Not Calculated  Antegrade Vertebral Flow Antegrade  168 Brachial Systolic Pressure (mmHg) 160  Multiphasic (subclavian artery) Brachial Artery Waveforms Multiphasic (subclavian artery)    Plaque Morphology:  HM = Homogeneous, HT = Heterogeneous, CP = Calcific Plaque, SP = Smooth Plaque, IP = Irregular Plaque     ADDITIONAL FINDINGS: . No significant stenosis of the bilateral external or common carotid arteries. . Mild changes in vessel diameter of bilateral distal carotid patches and native vessel.     IMPRESSION: Patent bilateral carotid endarterectomy sites with no bilateral internal carotid artery stenoses.    Compared to the previous exam:  No significant changes noted when compared to previous exam of 03/05/2015.[name]    RENAL ARTERY DUPLEX EVALUATION    INDICATION: Left renal artery stent  PREVIOUS INTERVENTION(S): Left renal artery stent in 10/2012    DUPLEX EXAM:     AORTA Peak Systolic Velocity (cm/s): 85    RIGHT  LEFT   Peak Systolic Velocities (cm/s) Comments  Peak Systolic Velocities (cm/s) Comments  327  Renal Artery Origin/Proximal 365   139  Renal Artery Mid 200   125  Renal Artery Distal 153   279  Accessory Renal Artery (when present)    5.1  Renal / Aortic Ratio (RAR) 5.9  11.9 Kidney Size (cm) 10.4  Patent  Renal Vein Patent    ADDITIONAL FINDINGS: . Decreased visualization of the abdominal vasculature due to overlying bowel gas. . The bilateral kidney length measurements are within normal limits. . Right accessory renal artery with maximum proximal velocity noted above.     IMPRESSION: Patent left renal artery stent with Doppler velocities and renal-aortic ratios suggest greater than 60% stenoses of the bilateral proximal renal arteries, based on limited visualization, as described above.    Compared to the previous exam:  No significant changes when compared to previous exam of 03/04/2014.[name]       Assessment: Darrell Willis is a 69 y.o. male who is s/p left CEA  bovine pericardial patch angioplasty on 06/22/2012, right CEA with patch angioplasty on 08/09/2013, and left renal artery stent placed 10/2012. He has left SFA occlusion with moderate left calf intermittent claudication, no rest pain, no tissue loss. He had a mild left hemispheric stroke in 2013. He is also being followed for bilateral renal artery stenosis, worse on the left, blood pressure remains in control.  Today's carotid artery Duplex suggests patent bilateral carotid endarterectomy sites  with no bilateral internal carotid artery stenoses. No significant changes when compared to previous exam of 03/04/2014.  Today's renal artery Duplex suggests a patent left renal artery stent with Doppler velocities and renal-aortic ratios suggest greater than 60% stenoses of the bilateral proximal renal arteries, based on limited visualization. No significant changes when compared to previous exam of 03/04/2014.  Unfortunately he continues to smoke and indicates that he does not intend to quit.  Face to face time with patient was 35 minutes. Over 50% of this time was spent on counseling and coordination of care.   Plan:  The patient was counseled re smoking cessation and given several free resources re smoking cessation.  Follow-up in 6 months with bilateral renal artery Duplex and ABI's, 1 year for Carotid Duplex.  I discussed in depth with the patient the nature of atherosclerosis, and emphasized the importance of maximal medical management including strict control of blood pressure, blood glucose, and lipid levels, obtaining regular exercise, and cessation of smoking.  The patient is aware that without maximal medical management the underlying atherosclerotic disease process will progress, limiting the benefit of any interventions. The patient was given information about stroke prevention and what symptoms should prompt the patient to seek immediate medical care. Thank you for allowing [place] to participate in this patient's care.  Charisse March, RN, MSN, FNP-C [facility] Office: [phone]  Clinic Physician: Imogene Burn  10/25/2014 11:56 AM

## 2014-10-25 NOTE — Progress Notes (Signed)
Filed Vitals:   10/25/14 1133 10/25/14 1136 10/25/14 1147 10/25/14 1148  BP: 152/84 145/76 141/78 128/77  Pulse: 70 71 71 71  Resp:  16    Height:  5\' 6"  (1.676 m)    Weight:  172 lb (78.019 kg)    SpO2:  96%

## 2014-10-25 NOTE — Patient Instructions (Signed)
Stroke Prevention Some medical conditions and behaviors are associated with an increased chance of having a stroke. You may prevent a stroke by making healthy choices and managing medical conditions. HOW CAN I REDUCE MY RISK OF HAVING A STROKE?   Stay physically active. Get at least 30 minutes of activity on most or all days.  Do not smoke. It may also be helpful to avoid exposure to secondhand smoke.  Limit alcohol use. Moderate alcohol use is considered to be:  No more than 2 drinks per day for men.  No more than 1 drink per day for nonpregnant women.  Eat healthy foods. This involves:  Eating 5 or more servings of fruits and vegetables a day.  Making dietary changes that address high blood pressure (hypertension), high cholesterol, diabetes, or obesity.  Manage your cholesterol levels.  Making food choices that are high in fiber and low in saturated fat, trans fat, and cholesterol may control cholesterol levels.  Take any prescribed medicines to control cholesterol as directed by your health care provider.  Manage your diabetes.  Controlling your carbohydrate and sugar intake is recommended to manage diabetes.  Take any prescribed medicines to control diabetes as directed by your health care provider.  Control your hypertension.  Making food choices that are low in salt (sodium), saturated fat, trans fat, and cholesterol is recommended to manage hypertension.  Take any prescribed medicines to control hypertension as directed by your health care provider.  Maintain a healthy weight.  Reducing calorie intake and making food choices that are low in sodium, saturated fat, trans fat, and cholesterol are recommended to manage weight.  Stop drug abuse.  Avoid taking birth control pills.  Talk to your health care provider about the risks of taking birth control pills if you are over 35 years old, smoke, get migraines, or have ever had a blood clot.  Get evaluated for sleep  disorders (sleep apnea).  Talk to your health care provider about getting a sleep evaluation if you snore a lot or have excessive sleepiness.  Take medicines only as directed by your health care provider.  For some people, aspirin or blood thinners (anticoagulants) are helpful in reducing the risk of forming abnormal blood clots that can lead to stroke. If you have the irregular heart rhythm of atrial fibrillation, you should be on a blood thinner unless there is a good reason you cannot take them.  Understand all your medicine instructions.  Make sure that other conditions (such as anemia or atherosclerosis) are addressed. SEEK IMMEDIATE MEDICAL CARE IF:   You have sudden weakness or numbness of the face, arm, or leg, especially on one side of the body.  Your face or eyelid droops to one side.  You have sudden confusion.  You have trouble speaking (aphasia) or understanding.  You have sudden trouble seeing in one or both eyes.  You have sudden trouble walking.  You have dizziness.  You have a loss of balance or coordination.  You have a sudden, severe headache with no known cause.  You have new chest pain or an irregular heartbeat. Any of these symptoms may represent a serious problem that is an emergency. Do not wait to see if the symptoms will go away. Get medical help at once. Call your local emergency services (911 in [place] Do not drive yourself to the hospital. Document Released: 08/05/2004 Document Revised: 11/12/2013 Document Reviewed: 12/29/2012 [employer] Patient Information 2015 [employer]. This information is not intended to replace advice given   to you by your health care provider. Make sure you discuss any questions you have with your health care provider.    Peripheral Vascular Disease Peripheral Vascular Disease (PVD), also called Peripheral Arterial Disease (PAD), is a circulation problem caused by cholesterol (atherosclerotic plaque) deposits in the  arteries. PVD commonly occurs in the lower extremities (legs) but it can occur in other areas of the body, such as your arms. The cholesterol buildup in the arteries reduces blood flow which can cause pain and other serious problems. The presence of PVD can place a person at risk for Coronary Artery Disease (CAD).  CAUSES  Causes of PVD can be many. It is usually associated with more than one risk factor such as:   High Cholesterol.  Smoking.  Diabetes.  Lack of exercise or inactivity.  High blood pressure (hypertension).  Obesity.  Family history. SYMPTOMS   When the lower extremities are affected, patients with PVD may experience:  Leg pain with exertion or physical activity. This is called INTERMITTENT CLAUDICATION. This may present as cramping or numbness with physical activity. The location of the pain is associated with the level of blockage. For example, blockage at the abdominal level (distal abdominal aorta) may result in buttock or hip pain. Lower leg arterial blockage may result in calf pain.  As PVD becomes more severe, pain can develop with less physical activity.  In people with severe PVD, leg pain may occur at rest.  Other PVD signs and symptoms:  Leg numbness or weakness.  Coldness in the affected leg or foot, especially when compared to the other leg.  A change in leg color.  Patients with significant PVD are more prone to ulcers or sores on toes, feet or legs. These may take longer to heal or may reoccur. The ulcers or sores can become infected.  If signs and symptoms of PVD are ignored, gangrene may occur. This can result in the loss of toes or loss of an entire limb.  Not all leg pain is related to PVD. Other medical conditions can cause leg pain such as:  Blood clots (embolism) or Deep Vein Thrombosis.  Inflammation of the blood vessels (vasculitis).  Spinal stenosis. DIAGNOSIS  Diagnosis of PVD can involve several different types of tests. These  can include:  Pulse Volume Recording Method (PVR). This test is simple, painless and does not involve the use of X-rays. PVR involves measuring and comparing the blood pressure in the arms and legs. An ABI (Ankle-Brachial Index) is calculated. The normal ratio of blood pressures is 1. As this number becomes smaller, it indicates more severe disease.  < 0.95 - indicates significant narrowing in one or more leg vessels.  <0.8 - there will usually be pain in the foot, leg or buttock with exercise.  <0.4 - will usually have pain in the legs at rest.  <0.25 - usually indicates limb threatening PVD.  Doppler detection of pulses in the legs. This test is painless and checks to see if you have a pulses in your legs/feet.  A dye or contrast material (a substance that highlights the blood vessels so they show up on x-ray) may be given to help your caregiver better see the arteries for the following tests. The dye is eliminated from your body by the kidney's. Your caregiver may order blood work to check your kidney function and other laboratory values before the following tests are performed:  Magnetic Resonance Angiography (MRA). An MRA is a picture study of the blood   vessels and arteries. The MRA machine uses a large magnet to produce images of the blood vessels.  Computed Tomography Angiography (CTA). A CTA is a specialized x-ray that looks at how the blood flows in your blood vessels. An IV may be inserted into your arm so contrast dye can be injected.  Angiogram. Is a procedure that uses x-rays to look at your blood vessels. This procedure is minimally invasive, meaning a small incision (cut) is made in your groin. A small tube (catheter) is then inserted into the artery of your groin. The catheter is guided to the blood vessel or artery your caregiver wants to examine. Contrast dye is injected into the catheter. X-rays are then taken of the blood vessel or artery. After the images are obtained, the  catheter is taken out. TREATMENT  Treatment of PVD involves many interventions which may include:  Lifestyle changes:  Quitting smoking.  Exercise.  Following a low fat, low cholesterol diet.  Control of diabetes.  Foot care is very important to the PVD patient. Good foot care can help prevent infection.  Medication:  Cholesterol-lowering medicine.  Blood pressure medicine.  Anti-platelet drugs.  Certain medicines may reduce symptoms of Intermittent Claudication.  Interventional/Surgical options:  Angioplasty. An Angioplasty is a procedure that inflates a balloon in the blocked artery. This opens the blocked artery to improve blood flow.  Stent Implant. A wire mesh tube (stent) is placed in the artery. The stent expands and stays in place, allowing the artery to remain open.  Peripheral Bypass Surgery. This is a surgical procedure that reroutes the blood around a blocked artery to help improve blood flow. This type of procedure may be performed if Angioplasty or stent implants are not an option. SEEK IMMEDIATE MEDICAL CARE IF:   You develop pain or numbness in your arms or legs.  Your arm or leg turns cold, becomes blue in color.  You develop redness, warmth, swelling and pain in your arms or legs. MAKE SURE YOU:   Understand these instructions.  Will watch your condition.  Will get help right away if you are not doing well or get worse. Document Released: 08/05/2004 Document Revised: 09/20/2011 Document Reviewed: 07/02/2008 [facility] Patient Information 2015 [place], [place]. This information is not intended to replace advice given to you by your health care provider. Make sure you discuss any questions you have with your health care provider.    Renal Artery Stenosis Renal artery stenosis (RAS) is the narrowing of the artery that supplies blood to the kidney. If the narrowing is critical and the kidney does not get enough blood, hypertension (high blood pressure)  can develop. This is called renal vascular hypertension (RVH). This is a common, uncommon cause of secondary hypertension. It does not usually happen until there is at least a 70% narrowing of the artery. Decreased blood flow through the renal artery causes the kidney to release increased amounts of a hormone. It is called renin. Renin is a strong blood pressure regulator. When it is high, it causes changes that lead to hypertension. Eventually the kidney not receiving enough blood may shrink in size and become less useful. The high blood pressure that is produced can eventually damage and destroy the remaining kidney. This is called hypertensive nephrosclerosis. If both kidneys fail, it will lead to chronic renal failure.  CAUSES  Most renal artery stenosis is caused by a hardening of the arteries (atherosclerosis). This is called Atherosclerotic Renal Artery Stenosis (AS-RAS). It is caused by a build-up  of cholesterol (plaques) on the inner lining of the renal artery. A much less common cause is Fibromuscular Dysplasia (FMD). With it, there is an abnormality in the muscular lining of the renal artery. FMD-RAS occurs almost exclusively in women aged 108 to 70. It rarely affects African Americans or Asians.  SYMPTOMS  Often high blood pressure is discovered on a routine blood pressure check. It may be the only sign that something is wrong. Other problems that may occur are:  You may develop calf pain when walking. This is called intermittent claudication. It may be a sign of bad circulation in the legs.  Inability to use certain blood pressure pills such as angiotensin-I (ACE-I) inhibitors or angiotensin receptor blockers (ARB's). These could cause sudden drops in blood pressure with worsening of kidney function.  More than three antihypertensive medications may be needed for blood pressure control.  New onset of high blood pressure if you are over 55. DIAGNOSIS  Your caregiver may find suggestions of  this on exam if he finds bruits (like murmurs) on listening to your abdomen (belly) or the large arteries in your neck. Your caregiver may also suspect this there is a sudden worsening of your blood pressure when it has been well controlled and you are over age 43. Additional testing that may be done includes:  One diagnostic method used for renal artery stenosis (RAS) is to measure and compare the level of renin, (blood pressure-regulating hormone released by the kidneys), in the right to the left renal veins. If the amount of renin released by one-side is markedly higher than the other, this identifies a high renin-releasing kidney consistent with RAS.  FMD-RAS is often found on renal scan with ACE-inhibitor challenge, or ultrasound with Doppler.  FMD responds well to angioplasty and stenting. The results of stenting in FMD are usually long lasting. RISK FACTORS  Most renal artery stenosis is caused by a hardening of the arteries. This is called atherosclerosis. Other risk factors associated with the development of atherosclerotic RAS include the following:   Carotid artery disease.  Obesity.  High blood pressure.  Heredity.  Old age.  Fibromuscular dysplasia.  Diabetes mellitus.  Smoking.  Hardening of the arteries. TREATMENT   Renal vascular hypertension can be very severe. It can also be difficult to control.  Medication is used to control high blood pressure (hypertension). Blood pressure medications that directly affect the renin angiotensin pathway can be used toe help control blood pressure. ACE inhibitors and angiotensin receptor blockers (ARB's) are often effective in patients with unilateral RAS. In some cases, patients with RAS are resistant to these medications.  In patients with bilateral RAS, these medications must be used carefully. They may cause acute renal failure (ARF). If acute renal failure develops (if creatinine increases by more than 30%), the medication is  discontinued. The patient is evaluated for bilateral RAS.  Angioplasty and stenting may be used to improve blood flow. The goal is to improve the circulation of blood flow to the kidney and prevent the release of excess renin, which can help to decrease blood pressure. This helps to prevent atrophy of the kidney. In general, patients with AS-RAS should have stenting done. This is because plasty by itself has a high incidence of re-stenosis.  Surgery to bypass the narrowing may be done. If the kidney with RAS has diminished in size or strength (atrophied ), surgical removal of the kidney may be advised. This is called nephrectomy. Document Released: 03/24/2005 Document Revised: 09/20/2011 Document  Reviewed: 10/11/2013 [employer] Patient Information 2015 [place], [place]. This information is not intended to replace advice given to you by your health care provider. Make sure you discuss any questions you have with your health care provider.    Smoking Cessation Quitting smoking is important to your health and has many advantages. However, it is not always easy to quit since nicotine is a very addictive drug. Oftentimes, people try 3 times or more before being able to quit. This document explains the best ways for you to prepare to quit smoking. Quitting takes hard work and a lot of effort, but you can do it. ADVANTAGES OF QUITTING SMOKING  You will live longer, feel better, and live better.  Your body will feel the impact of quitting smoking almost immediately.  Within 20 minutes, blood pressure decreases. Your pulse returns to its normal level.  After 8 hours, carbon monoxide levels in the blood return to normal. Your oxygen level increases.  After 24 hours, the chance of having a heart attack starts to decrease. Your breath, hair, and body stop smelling like smoke.  After 48 hours, damaged nerve endings begin to recover. Your sense of taste and smell improve.  After 72 hours, the body is  virtually free of nicotine. Your bronchial tubes relax and breathing becomes easier.  After 2 to 12 weeks, lungs can hold more air. Exercise becomes easier and circulation improves.  The risk of having a heart attack, stroke, cancer, or lung disease is greatly reduced.  After 1 year, the risk of coronary heart disease is cut in half.  After 5 years, the risk of stroke falls to the same as a nonsmoker.  After 10 years, the risk of lung cancer is cut in half and the risk of other cancers decreases significantly.  After 15 years, the risk of coronary heart disease drops, usually to the level of a nonsmoker.  If you are pregnant, quitting smoking will improve your chances of having a healthy baby.  The people you live with, especially any children, will be healthier.  You will have extra money to spend on things other than cigarettes. QUESTIONS TO THINK ABOUT BEFORE ATTEMPTING TO QUIT You may want to talk about your answers with your health care provider.  Why do you want to quit?  If you tried to quit in the past, what helped and what did not?  What will be the most difficult situations for you after you quit? How will you plan to handle them?  Who can help you through the tough times? Your family? Friends? A health care provider?  What pleasures do you get from smoking? What ways can you still get pleasure if you quit? Here are some questions to ask your health care provider:  How can you help me to be successful at quitting?  What medicine do you think would be best for me and how should I take it?  What should I do if I need more help?  What is smoking withdrawal like? How can I get information on withdrawal? GET READY  Set a quit date.  Change your environment by getting rid of all cigarettes, ashtrays, matches, and lighters in your home, car, or work. Do not let people smoke in your home.  Review your past attempts to quit. Think about what worked and what did  not. GET SUPPORT AND ENCOURAGEMENT You have a better chance of being successful if you have help. You can get support in many ways.  Tell  your family, friends, and coworkers that you are going to quit and need their support. Ask them not to smoke around you.  Get individual, group, or telephone counseling and support. Programs are available at [employer] and health centers. Call your local health department for information about programs in your area.  Spiritual beliefs and practices may help some smokers quit.  Download a "quit meter" on your computer to keep track of quit statistics, such as how long you have gone without smoking, cigarettes not smoked, and money saved.  Get a self-help book about quitting smoking and staying off tobacco. LEARN NEW SKILLS AND BEHAVIORS  Distract yourself from urges to smoke. Talk to someone, go for a walk, or occupy your time with a task.  Change your normal routine. Take a different route to work. Drink tea instead of coffee. Eat breakfast in a different place.  Reduce your stress. Take a hot bath, exercise, or read a book.  Plan something enjoyable to do every day. Reward yourself for not smoking.  Explore interactive web-based programs that specialize in helping you quit. GET MEDICINE AND USE IT CORRECTLY Medicines can help you stop smoking and decrease the urge to smoke. Combining medicine with the above behavioral methods and support can greatly increase your chances of successfully quitting smoking.  Nicotine replacement therapy helps deliver nicotine to your body without the negative effects and risks of smoking. Nicotine replacement therapy includes nicotine gum, lozenges, inhalers, nasal sprays, and skin patches. Some may be available over-the-counter and others require a prescription.  Antidepressant medicine helps people abstain from smoking, but how this works is unknown. This medicine is available by prescription.  Nicotinic  receptor partial agonist medicine simulates the effect of nicotine in your brain. This medicine is available by prescription. Ask your health care provider for advice about which medicines to use and how to use them based on your health history. Your health care provider will tell you what side effects to look out for if you choose to be on a medicine or therapy. Carefully read the information on the package. Do not use any other product containing nicotine while using a nicotine replacement product.  RELAPSE OR DIFFICULT SITUATIONS Most relapses occur within the first 3 months after quitting. Do not be discouraged if you start smoking again. Remember, most people try several times before finally quitting. You may have symptoms of withdrawal because your body is used to nicotine. You may crave cigarettes, be irritable, feel very hungry, cough often, get headaches, or have difficulty concentrating. The withdrawal symptoms are only temporary. They are strongest when you first quit, but they will go away within 10-14 days. To reduce the chances of relapse, try to:  Avoid drinking alcohol. Drinking lowers your chances of successfully quitting.  Reduce the amount of caffeine you consume. Once you quit smoking, the amount of caffeine in your body increases and can give you symptoms, such as a rapid heartbeat, sweating, and anxiety.  Avoid smokers because they can make you want to smoke.  Do not let weight gain distract you. Many smokers will gain weight when they quit, usually less than 10 pounds. Eat a healthy diet and stay active. You can always lose the weight gained after you quit.  Find ways to improve your mood other than smoking. FOR MORE INFORMATION  www.smokefree.gov  Document Released: 06/22/2001 Document Revised: 11/12/2013 Document Reviewed: 10/07/2011 [facility] Patient Information 2015 [place], [place]. This information is not intended to replace advice given to you  by your health care  provider. Make sure you discuss any questions you have with your health care provider.    Smoking Cessation, Tips for Success If you are ready to quit smoking, congratulations! You have chosen to help yourself be healthier. Cigarettes bring nicotine, tar, carbon monoxide, and other irritants into your body. Your lungs, heart, and blood vessels will be able to work better without these poisons. There are many different ways to quit smoking. Nicotine gum, nicotine patches, a nicotine inhaler, or nicotine nasal spray can help with physical craving. Hypnosis, support groups, and medicines help break the habit of smoking. WHAT THINGS CAN I DO TO MAKE QUITTING EASIER?  Here are some tips to help you quit for good:  Pick a date when you will quit smoking completely. Tell all of your friends and family about your plan to quit on that date.  Do not try to slowly cut down on the number of cigarettes you are smoking. Pick a quit date and quit smoking completely starting on that day.  Throw away all cigarettes.   Clean and remove all ashtrays from your home, work, and car.  On a card, write down your reasons for quitting. Carry the card with you and read it when you get the urge to smoke.  Cleanse your body of nicotine. Drink enough water and fluids to keep your urine clear or pale yellow. Do this after quitting to flush the nicotine from your body.  Learn to predict your moods. Do not let a bad situation be your excuse to have a cigarette. Some situations in your life might tempt you into wanting a cigarette.  Never have "just one" cigarette. It leads to wanting another and another. Remind yourself of your decision to quit.  Change habits associated with smoking. If you smoked while driving or when feeling stressed, try other activities to replace smoking. Stand up when drinking your coffee. Brush your teeth after eating. Sit in a different chair when you read the paper. Avoid alcohol while trying to  quit, and try to drink fewer caffeinated beverages. Alcohol and caffeine may urge you to smoke.  Avoid foods and drinks that can trigger a desire to smoke, such as sugary or spicy foods and alcohol.  Ask people who smoke not to smoke around you.  Have something planned to do right after eating or having a cup of coffee. For example, plan to take a walk or exercise.  Try a relaxation exercise to calm you down and decrease your stress. Remember, you may be tense and nervous for the first 2 weeks after you quit, but this will pass.  Find new activities to keep your hands busy. Play with a pen, coin, or rubber band. Doodle or draw things on paper.  Brush your teeth right after eating. This will help cut down on the craving for the taste of tobacco after meals. You can also try mouthwash.   Use oral substitutes in place of cigarettes. Try using lemon drops, carrots, cinnamon sticks, or chewing gum. Keep them handy so they are available when you have the urge to smoke.  When you have the urge to smoke, try deep breathing.  Designate your home as a nonsmoking area.  If you are a heavy smoker, ask your health care provider about a prescription for nicotine chewing gum. It can ease your withdrawal from nicotine.  Reward yourself. Set aside the cigarette money you save and buy yourself something nice.  Look for support from  others. Join a support group or smoking cessation program. Ask someone at home or at work to help you with your plan to quit smoking.  Always ask yourself, "Do I need this cigarette or is this just a reflex?" Tell yourself, "Today, I choose not to smoke," or "I do not want to smoke." You are reminding yourself of your decision to quit.  Do not replace cigarette smoking with electronic cigarettes (commonly called e-cigarettes). The safety of e-cigarettes is unknown, and some may contain harmful chemicals.  If you relapse, do not give up! Plan ahead and think about what you  will do the next time you get the urge to smoke. HOW WILL I FEEL WHEN I QUIT SMOKING? You may have symptoms of withdrawal because your body is used to nicotine (the addictive substance in cigarettes). You may crave cigarettes, be irritable, feel very hungry, cough often, get headaches, or have difficulty concentrating. The withdrawal symptoms are only temporary. They are strongest when you first quit but will go away within 10-14 days. When withdrawal symptoms occur, stay in control. Think about your reasons for quitting. Remind yourself that these are signs that your body is healing and getting used to being without cigarettes. Remember that withdrawal symptoms are easier to treat than the major diseases that smoking can cause.  Even after the withdrawal is over, expect periodic urges to smoke. However, these cravings are generally short lived and will go away whether you smoke or not. Do not smoke! WHAT RESOURCES ARE AVAILABLE TO HELP ME QUIT SMOKING? Your health care provider can direct you to community resources or hospitals for support, which may include:  Group support.  Education.  Hypnosis.  Therapy. Document Released: 03/26/2004 Document Revised: 11/12/2013 Document Reviewed: 12/14/2012 [facility] Patient Information 2015 [place], [place]. This information is not intended to replace advice given to you by your health care provider. Make sure you discuss any questions you have with your health care provider.

## 2014-11-16 IMAGING — CR DG CHEST 2V
2 series · 2 of 2 positions shown · non-contrast
Comparison: May 29, 2012.

CLINICAL DATA: Hypertension.

CHEST - 2 VIEW

[view not recorded (1 of 2)]
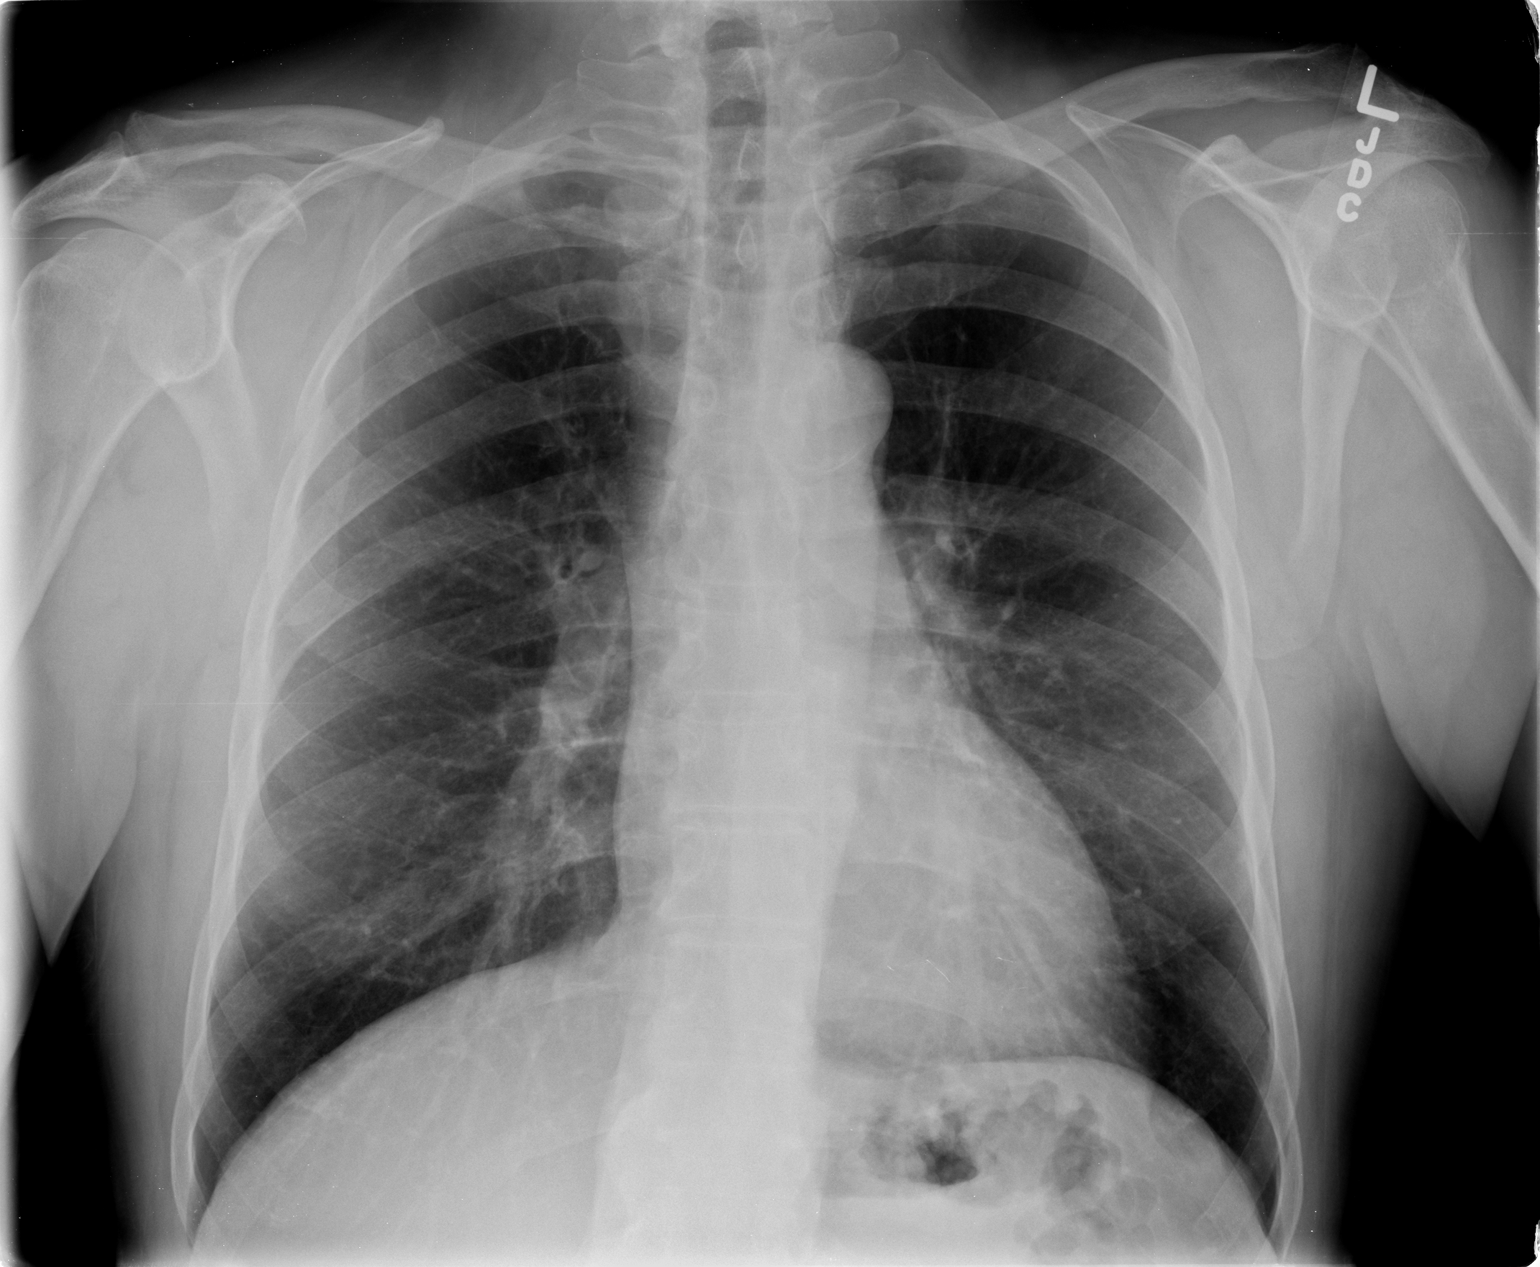

[view not recorded (2 of 2)]
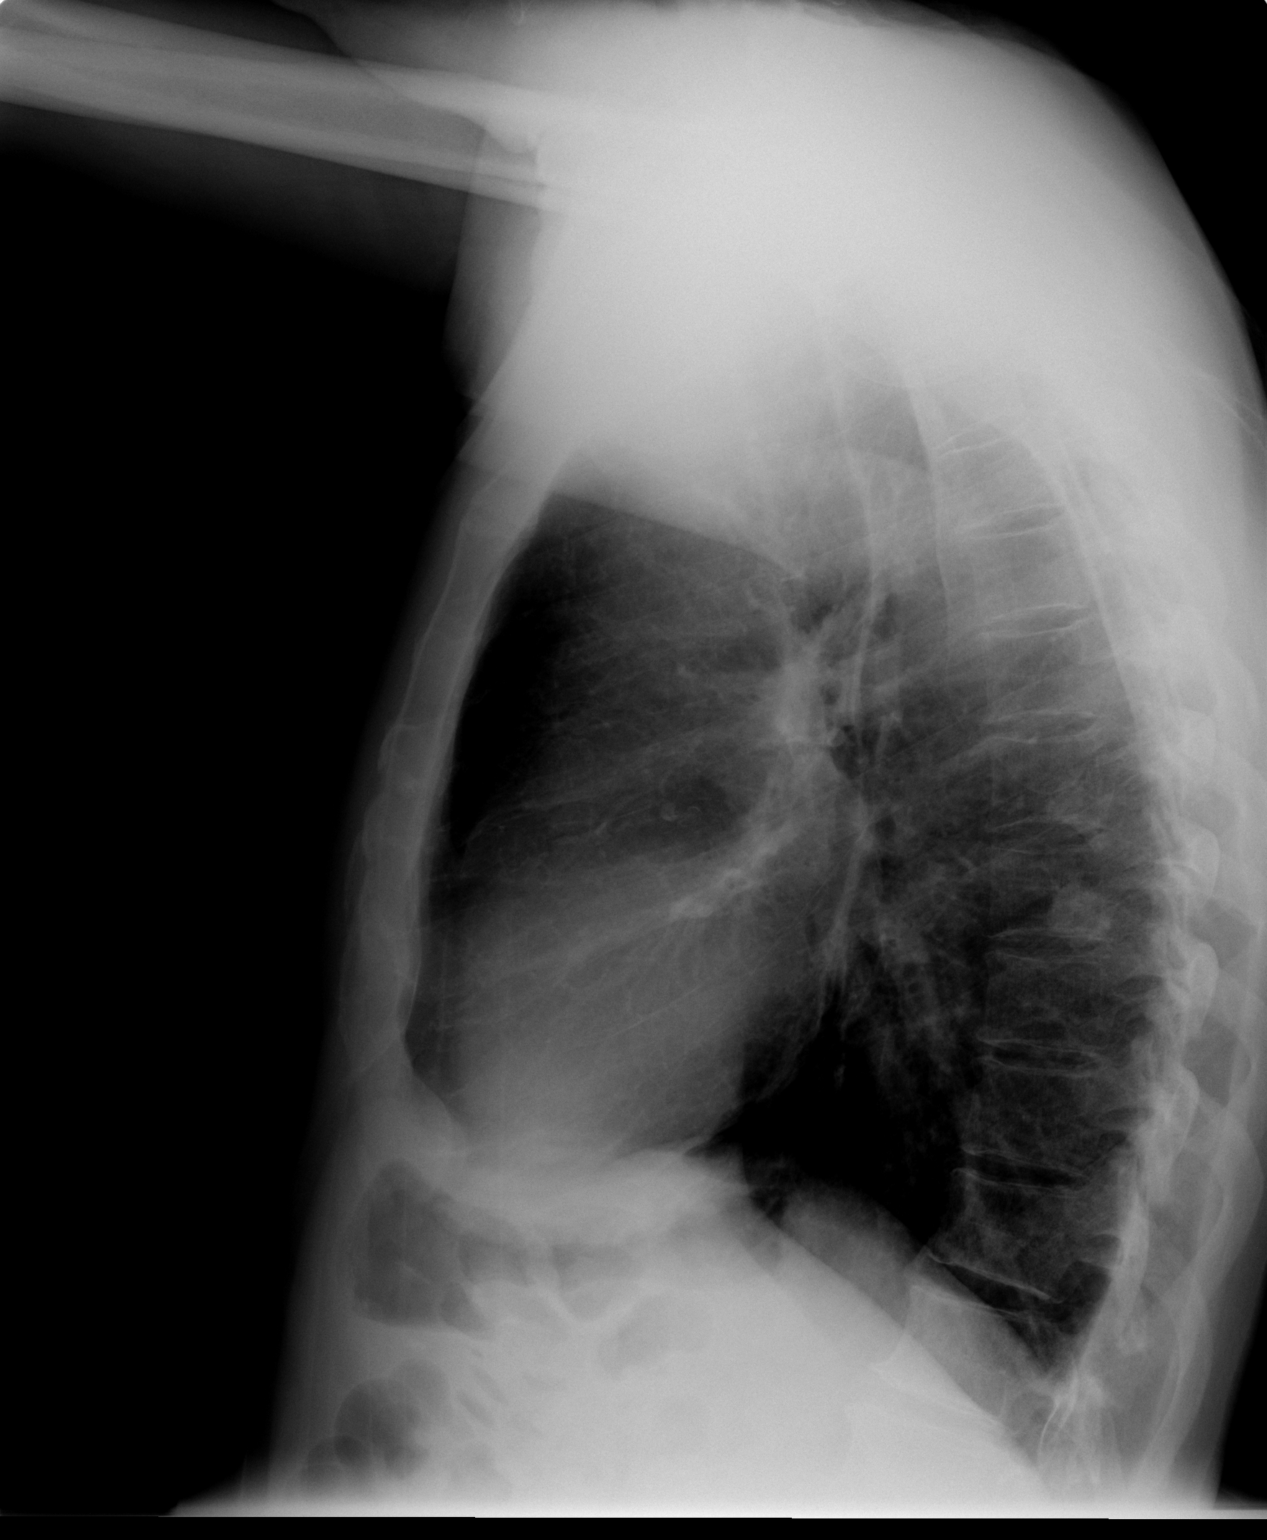

[2 of 2 positions shown; findings below may reference images not displayed]

FINDINGS: Cardiomediastinal silhouette appears normal.  No
pneumonia or atelectasis is noted.  Bony thorax is intact. Rounded
density is seen projected over lower thoracic spine on lateral
projection only; CT scan of chest is recommended to rule out
possible pulmonary nodule or mass.
IMPRESSION: Possible nodule or mass seen projected over lower thoracic spine on
the lateral projection only.  CT scan of chest is recommended for
further evaluation.

## 2015-01-23 DIAGNOSIS — Z1389 Encounter for screening for other disorder: Secondary | ICD-10-CM | POA: Diagnosis not present

## 2015-01-23 DIAGNOSIS — E784 Other hyperlipidemia: Secondary | ICD-10-CM | POA: Diagnosis not present

## 2015-01-23 DIAGNOSIS — I10 Essential (primary) hypertension: Secondary | ICD-10-CM | POA: Diagnosis not present

## 2015-01-23 DIAGNOSIS — I6340 Cerebral infarction due to embolism of unspecified cerebral artery: Secondary | ICD-10-CM | POA: Diagnosis not present

## 2015-01-23 DIAGNOSIS — M109 Gout, unspecified: Secondary | ICD-10-CM | POA: Diagnosis not present

## 2015-03-26 DIAGNOSIS — Z6827 Body mass index (BMI) 27.0-27.9, adult: Secondary | ICD-10-CM | POA: Diagnosis not present

## 2015-03-26 DIAGNOSIS — B029 Zoster without complications: Secondary | ICD-10-CM | POA: Diagnosis not present

## 2015-03-28 DIAGNOSIS — B029 Zoster without complications: Secondary | ICD-10-CM | POA: Diagnosis not present

## 2015-04-25 DIAGNOSIS — K210 Gastro-esophageal reflux disease with esophagitis: Secondary | ICD-10-CM | POA: Diagnosis not present

## 2015-04-25 DIAGNOSIS — M109 Gout, unspecified: Secondary | ICD-10-CM | POA: Diagnosis not present

## 2015-04-25 DIAGNOSIS — I6340 Cerebral infarction due to embolism of unspecified cerebral artery: Secondary | ICD-10-CM | POA: Diagnosis not present

## 2015-04-25 DIAGNOSIS — I10 Essential (primary) hypertension: Secondary | ICD-10-CM | POA: Diagnosis not present

## 2015-04-25 DIAGNOSIS — E784 Other hyperlipidemia: Secondary | ICD-10-CM | POA: Diagnosis not present

## 2015-05-02 ENCOUNTER — Ambulatory Visit: Payer: Medicare Other | Admitting: Family

## 2015-05-02 ENCOUNTER — Encounter (HOSPITAL_COMMUNITY): Payer: Medicare Other

## 2015-07-28 DIAGNOSIS — I10 Essential (primary) hypertension: Secondary | ICD-10-CM | POA: Diagnosis not present

## 2015-07-28 DIAGNOSIS — R600 Localized edema: Secondary | ICD-10-CM | POA: Diagnosis not present

## 2015-07-28 DIAGNOSIS — E784 Other hyperlipidemia: Secondary | ICD-10-CM | POA: Diagnosis not present

## 2015-07-28 DIAGNOSIS — K210 Gastro-esophageal reflux disease with esophagitis: Secondary | ICD-10-CM | POA: Diagnosis not present

## 2015-07-28 DIAGNOSIS — R609 Edema, unspecified: Secondary | ICD-10-CM | POA: Diagnosis not present

## 2015-07-28 DIAGNOSIS — Z125 Encounter for screening for malignant neoplasm of prostate: Secondary | ICD-10-CM | POA: Diagnosis not present

## 2015-07-28 DIAGNOSIS — I6340 Cerebral infarction due to embolism of unspecified cerebral artery: Secondary | ICD-10-CM | POA: Diagnosis not present

## 2015-08-20 ENCOUNTER — Encounter: Payer: Self-pay | Admitting: Cardiology

## 2015-08-21 ENCOUNTER — Encounter: Payer: Self-pay | Admitting: Family

## 2015-08-25 ENCOUNTER — Other Ambulatory Visit: Payer: Self-pay | Admitting: *Deleted

## 2015-08-25 DIAGNOSIS — Z9862 Peripheral vascular angioplasty status: Secondary | ICD-10-CM

## 2015-08-25 DIAGNOSIS — I701 Atherosclerosis of renal artery: Secondary | ICD-10-CM

## 2015-08-27 ENCOUNTER — Inpatient Hospital Stay (HOSPITAL_COMMUNITY): Admission: RE | Admit: 2015-08-27 | Payer: Medicare Other | Source: Ambulatory Visit

## 2015-08-27 ENCOUNTER — Ambulatory Visit: Payer: Medicare Other | Admitting: Family

## 2015-10-17 ENCOUNTER — Encounter: Payer: Self-pay | Admitting: Family

## 2015-10-27 ENCOUNTER — Encounter (HOSPITAL_COMMUNITY): Payer: Medicare Other

## 2015-10-27 ENCOUNTER — Ambulatory Visit: Payer: Medicare Other | Admitting: Family

## 2015-10-27 ENCOUNTER — Ambulatory Visit (HOSPITAL_COMMUNITY): Admission: RE | Admit: 2015-10-27 | Payer: Medicare Other | Source: Ambulatory Visit

## 2015-12-05 IMAGING — CT CT CHEST W/O CM
2 of 3 series · 15 of 36 positions shown, 18 images · IV contrast (Omnipaque 300)
Comparison: 06/22/2012.

CLINICAL DATA: Followup pulmonary nodules.

EXAM:
CT CHEST WITHOUT CONTRAST
TECHNIQUE: Multidetector CT imaging of the chest was performed following the
standard protocol without IV contrast.

[Series 2: chest routine with · axial · 0.71mm/px · z∈[-310,-40]mm · 12 of 64 slices shown, 15 images]
[im 5/64  mediastinal]
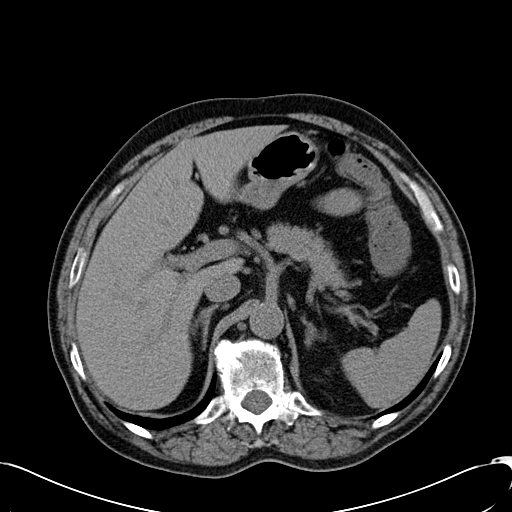
[im 5/64  lung]
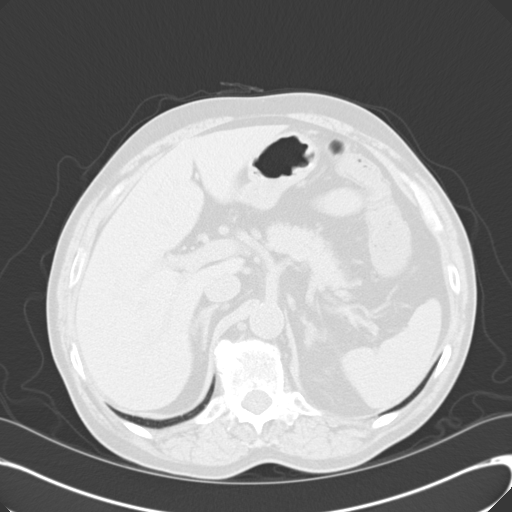
[im 10/64  lung]
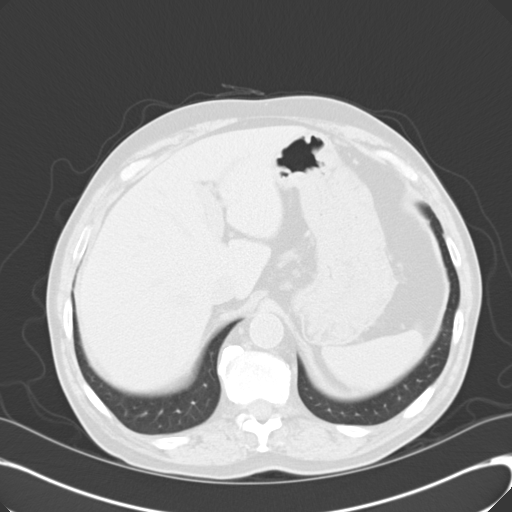
[im 15/64  lung]
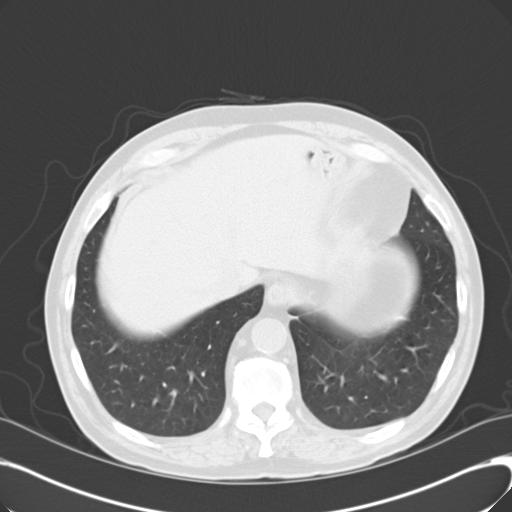
[im 19/64  lung]
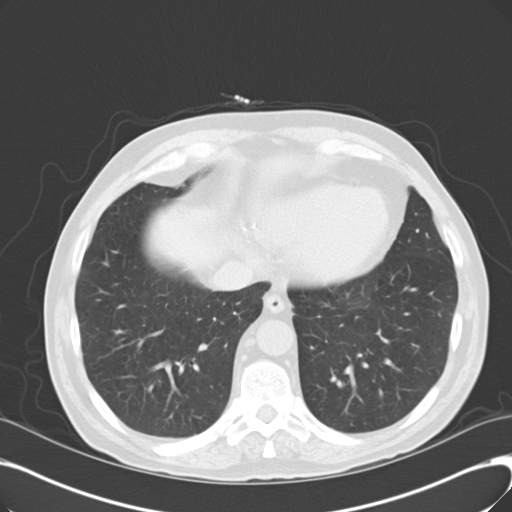
[im 24/64  mediastinal]
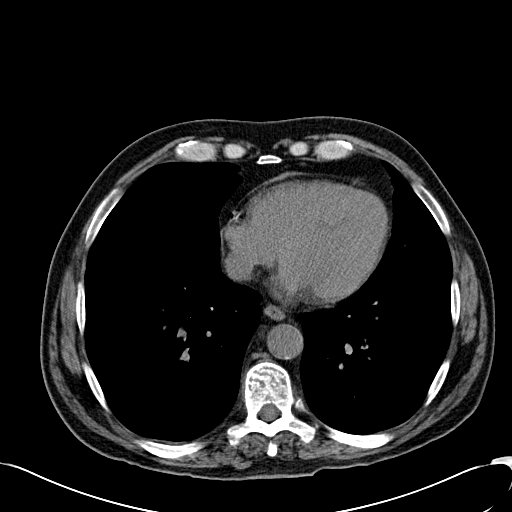
[im 24/64  lung]
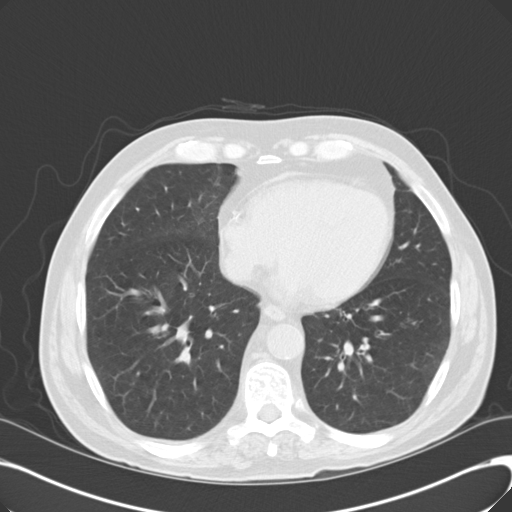
[im 29/64  lung]
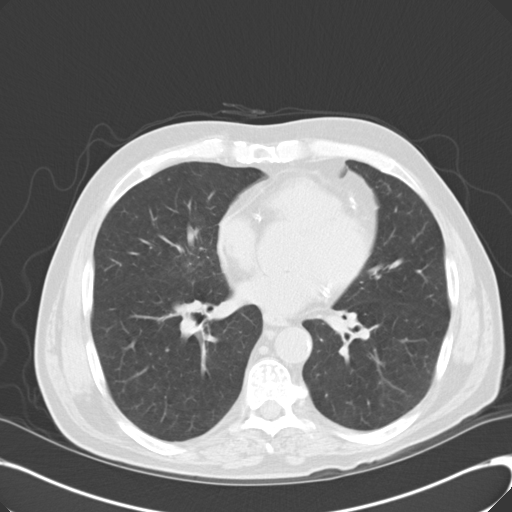
[im 36/64  lung]
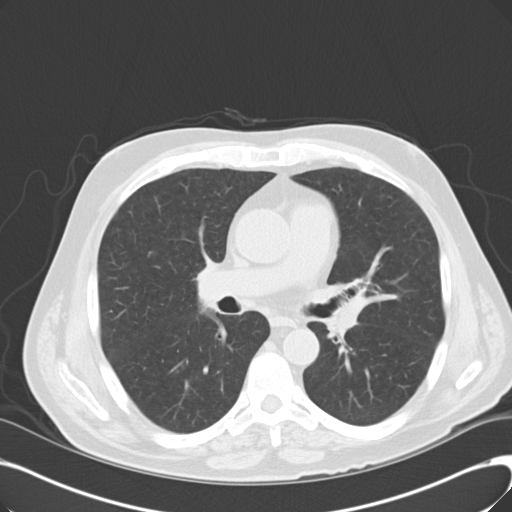
[im 40/64  lung]
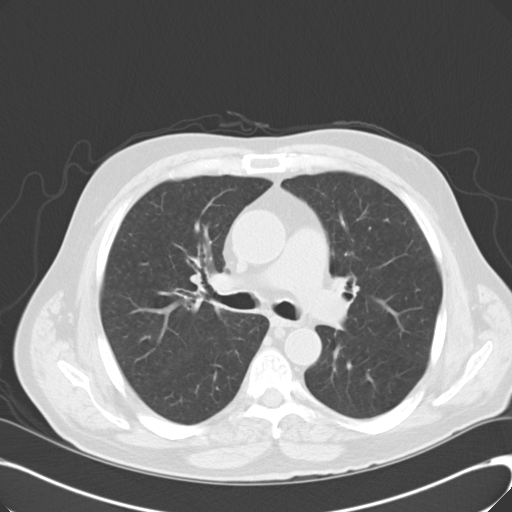
[im 45/64  mediastinal]
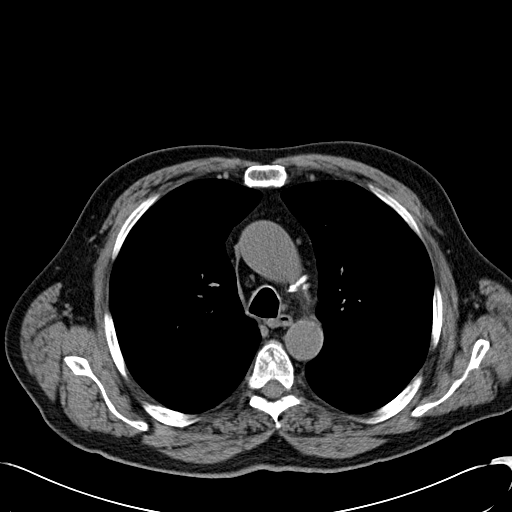
[im 45/64  lung]
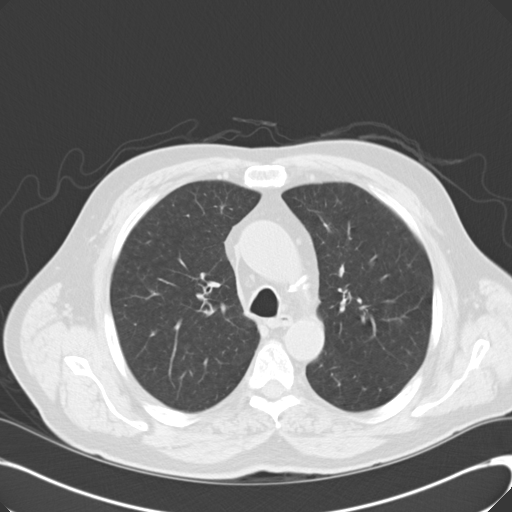
[im 50/64  lung]
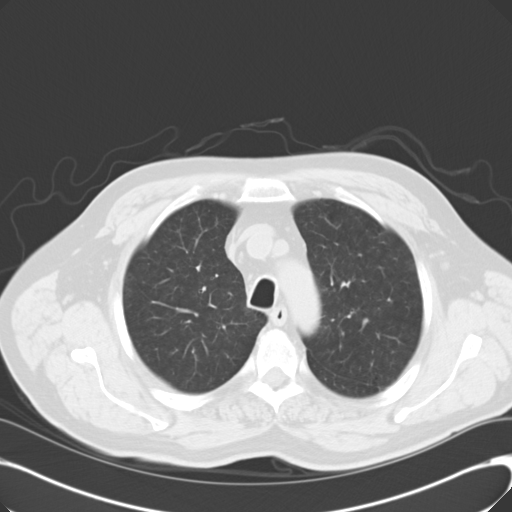
[im 54/64  lung]
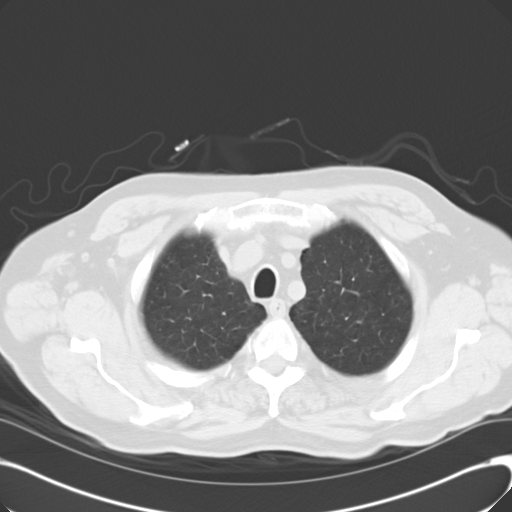
[im 59/64  lung]
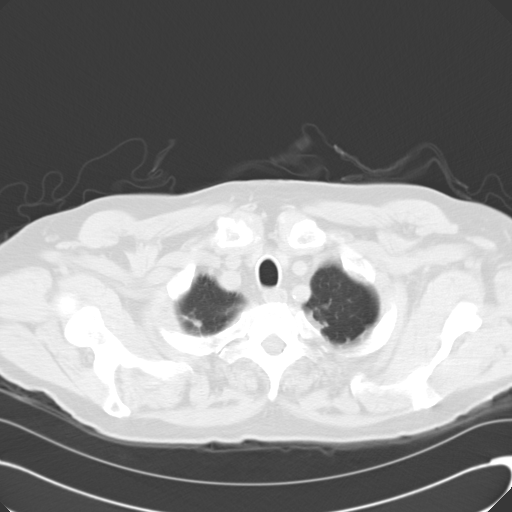

[Series 602: cor · coronal · 0.71mm/px · 3 of 114 slices shown]
[im 23/114  lung]
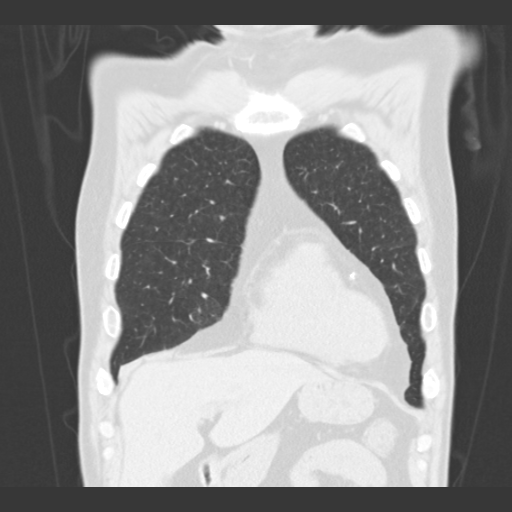
[im 46/114  lung]
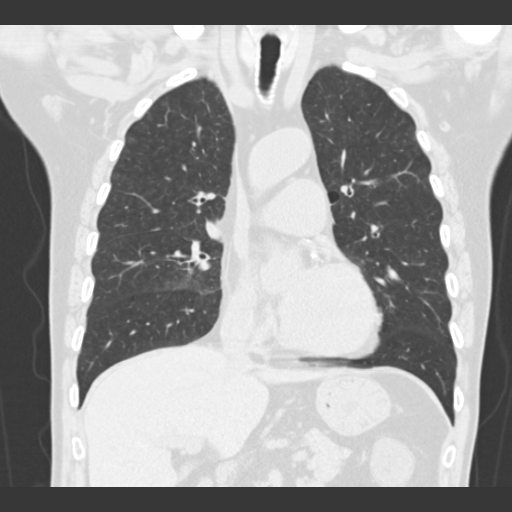
[im 68/114  lung]
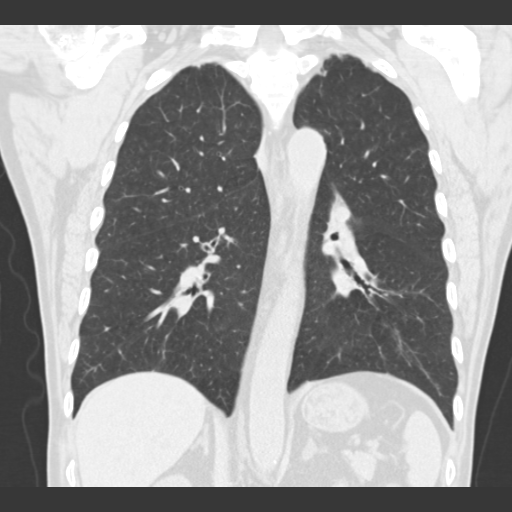

[15 of 36 positions shown; findings below may reference images not displayed]

FINDINGS: No pathologically enlarged mediastinal or axillary lymph nodes.
Hilar regions are difficult to definitively evaluate without IV
contrast but appear grossly unremarkable. Atherosclerotic
calcification of the arterial vasculature, including three-vessel
involvement of the coronary arteries. Pulmonary arteries are
enlarged. Heart size normal. No pericardial effusion. Scattered
lymph nodes along the course of the descending thoracic aorta are
subcentimeter in size.

Biapical pleural parenchymal scarring. Mild upper lung zone
peribronchovascular ill-defined ground-glass nodularity is
indicative of respiratory bronchiolitis, in this patient who is a
current everyday smoker. Scattered bilateral pulmonary nodules
measure 4 mm or less in size and are unchanged from 06/22/2012.
Lungs are otherwise clear. No pleural fluid. Airway is unremarkable.

Incidental imaging of the upper abdomen shows no acute findings. No
worrisome lytic or sclerotic lesions. Degenerative changes are seen
in the spine.
IMPRESSION: 1. Scattered tiny pulmonary nodules are unchanged from 06/22/2012
and are therefore considered benign.
2. Extensive 3 vessel coronary artery calcification.
3. Enlarged pulmonary arteries, indicative of pulmonary arterial
hypertension.

## 2016-03-04 DIAGNOSIS — Z9181 History of falling: Secondary | ICD-10-CM | POA: Diagnosis not present

## 2016-03-04 DIAGNOSIS — I10 Essential (primary) hypertension: Secondary | ICD-10-CM | POA: Diagnosis not present

## 2016-03-04 DIAGNOSIS — E784 Other hyperlipidemia: Secondary | ICD-10-CM | POA: Diagnosis not present

## 2016-09-27 DIAGNOSIS — Z23 Encounter for immunization: Secondary | ICD-10-CM | POA: Diagnosis not present

## 2016-09-27 DIAGNOSIS — M109 Gout, unspecified: Secondary | ICD-10-CM | POA: Diagnosis not present

## 2016-09-27 DIAGNOSIS — E784 Other hyperlipidemia: Secondary | ICD-10-CM | POA: Diagnosis not present

## 2016-09-27 DIAGNOSIS — Z125 Encounter for screening for malignant neoplasm of prostate: Secondary | ICD-10-CM | POA: Diagnosis not present

## 2016-09-27 DIAGNOSIS — I6340 Cerebral infarction due to embolism of unspecified cerebral artery: Secondary | ICD-10-CM | POA: Diagnosis not present

## 2017-04-07 DIAGNOSIS — M109 Gout, unspecified: Secondary | ICD-10-CM | POA: Diagnosis not present

## 2017-04-07 DIAGNOSIS — Z1389 Encounter for screening for other disorder: Secondary | ICD-10-CM | POA: Diagnosis not present

## 2017-04-07 DIAGNOSIS — I10 Essential (primary) hypertension: Secondary | ICD-10-CM | POA: Diagnosis not present

## 2017-04-07 DIAGNOSIS — Z9181 History of falling: Secondary | ICD-10-CM | POA: Diagnosis not present

## 2017-04-07 DIAGNOSIS — E784 Other hyperlipidemia: Secondary | ICD-10-CM | POA: Diagnosis not present

## 2017-04-15 DIAGNOSIS — Z23 Encounter for immunization: Secondary | ICD-10-CM | POA: Diagnosis not present

## 2017-12-22 DIAGNOSIS — Z1339 Encounter for screening examination for other mental health and behavioral disorders: Secondary | ICD-10-CM | POA: Diagnosis not present

## 2017-12-22 DIAGNOSIS — I6340 Cerebral infarction due to embolism of unspecified cerebral artery: Secondary | ICD-10-CM | POA: Diagnosis not present

## 2017-12-22 DIAGNOSIS — E785 Hyperlipidemia, unspecified: Secondary | ICD-10-CM | POA: Diagnosis not present

## 2017-12-22 DIAGNOSIS — Z125 Encounter for screening for malignant neoplasm of prostate: Secondary | ICD-10-CM | POA: Diagnosis not present

## 2017-12-22 DIAGNOSIS — I10 Essential (primary) hypertension: Secondary | ICD-10-CM | POA: Diagnosis not present

## 2018-02-20 ENCOUNTER — Other Ambulatory Visit: Payer: Self-pay

## 2018-02-20 NOTE — Patient Outreach (Signed)
[facility]) Care Management  02/20/2018  Darrell Willis Dec 05, 1945 [id number]   Medication Adherence call to Darrell Willis patient's telephone number belongs to someone else Darrell Willis is due on Atorvastatin 80 mg. patient is showing past due under [facility].   Darrell Willis CPhT Pharmacy Technician [facility] Management Direct Dial [phone]  Fax [phone] [email or web address]

## 2018-04-26 DIAGNOSIS — Z23 Encounter for immunization: Secondary | ICD-10-CM | POA: Diagnosis not present

## 2018-06-27 DIAGNOSIS — Z9181 History of falling: Secondary | ICD-10-CM | POA: Diagnosis not present

## 2018-06-27 DIAGNOSIS — I10 Essential (primary) hypertension: Secondary | ICD-10-CM | POA: Diagnosis not present

## 2018-06-27 DIAGNOSIS — Z23 Encounter for immunization: Secondary | ICD-10-CM | POA: Diagnosis not present

## 2018-06-27 DIAGNOSIS — E785 Hyperlipidemia, unspecified: Secondary | ICD-10-CM | POA: Diagnosis not present

## 2018-10-17 DIAGNOSIS — Z1212 Encounter for screening for malignant neoplasm of rectum: Secondary | ICD-10-CM | POA: Diagnosis not present

## 2019-01-08 DIAGNOSIS — Z8673 Personal history of transient ischemic attack (TIA), and cerebral infarction without residual deficits: Secondary | ICD-10-CM | POA: Diagnosis not present

## 2019-01-08 DIAGNOSIS — K210 Gastro-esophageal reflux disease with esophagitis: Secondary | ICD-10-CM | POA: Diagnosis not present

## 2019-01-08 DIAGNOSIS — I10 Essential (primary) hypertension: Secondary | ICD-10-CM | POA: Diagnosis not present

## 2019-01-08 DIAGNOSIS — M109 Gout, unspecified: Secondary | ICD-10-CM | POA: Diagnosis not present

## 2019-01-08 DIAGNOSIS — E785 Hyperlipidemia, unspecified: Secondary | ICD-10-CM | POA: Diagnosis not present

## 2019-05-16 DIAGNOSIS — Z23 Encounter for immunization: Secondary | ICD-10-CM | POA: Diagnosis not present

## 2019-07-10 DIAGNOSIS — E785 Hyperlipidemia, unspecified: Secondary | ICD-10-CM | POA: Diagnosis not present

## 2019-07-10 DIAGNOSIS — Z9181 History of falling: Secondary | ICD-10-CM | POA: Diagnosis not present

## 2019-07-10 DIAGNOSIS — I10 Essential (primary) hypertension: Secondary | ICD-10-CM | POA: Diagnosis not present

## 2020-01-07 DIAGNOSIS — I10 Essential (primary) hypertension: Secondary | ICD-10-CM | POA: Diagnosis not present

## 2020-01-07 DIAGNOSIS — M109 Gout, unspecified: Secondary | ICD-10-CM | POA: Diagnosis not present

## 2020-01-07 DIAGNOSIS — E785 Hyperlipidemia, unspecified: Secondary | ICD-10-CM | POA: Diagnosis not present

## 2020-01-07 DIAGNOSIS — Z139 Encounter for screening, unspecified: Secondary | ICD-10-CM | POA: Diagnosis not present

## 2020-01-07 DIAGNOSIS — Z8673 Personal history of transient ischemic attack (TIA), and cerebral infarction without residual deficits: Secondary | ICD-10-CM | POA: Diagnosis not present

## 2020-07-08 DIAGNOSIS — Z23 Encounter for immunization: Secondary | ICD-10-CM | POA: Diagnosis not present

## 2020-07-08 DIAGNOSIS — Z79899 Other long term (current) drug therapy: Secondary | ICD-10-CM | POA: Diagnosis not present

## 2020-07-08 DIAGNOSIS — R739 Hyperglycemia, unspecified: Secondary | ICD-10-CM | POA: Diagnosis not present

## 2020-07-08 DIAGNOSIS — D649 Anemia, unspecified: Secondary | ICD-10-CM | POA: Diagnosis not present

## 2020-07-08 DIAGNOSIS — E785 Hyperlipidemia, unspecified: Secondary | ICD-10-CM | POA: Diagnosis not present

## 2020-07-08 DIAGNOSIS — I10 Essential (primary) hypertension: Secondary | ICD-10-CM | POA: Diagnosis not present

## 2020-07-08 DIAGNOSIS — Z8673 Personal history of transient ischemic attack (TIA), and cerebral infarction without residual deficits: Secondary | ICD-10-CM | POA: Diagnosis not present

## 2020-08-14 DIAGNOSIS — E785 Hyperlipidemia, unspecified: Secondary | ICD-10-CM | POA: Diagnosis not present

## 2020-08-14 DIAGNOSIS — Z8673 Personal history of transient ischemic attack (TIA), and cerebral infarction without residual deficits: Secondary | ICD-10-CM | POA: Diagnosis not present

## 2020-08-14 DIAGNOSIS — I10 Essential (primary) hypertension: Secondary | ICD-10-CM | POA: Diagnosis not present

## 2020-08-14 DIAGNOSIS — D649 Anemia, unspecified: Secondary | ICD-10-CM | POA: Diagnosis not present

## 2020-08-14 DIAGNOSIS — M109 Gout, unspecified: Secondary | ICD-10-CM | POA: Diagnosis not present

## 2020-08-14 DIAGNOSIS — E538 Deficiency of other specified B group vitamins: Secondary | ICD-10-CM | POA: Diagnosis not present

## 2020-08-14 DIAGNOSIS — K2100 Gastro-esophageal reflux disease with esophagitis, without bleeding: Secondary | ICD-10-CM | POA: Diagnosis not present

## 2020-09-15 DIAGNOSIS — D649 Anemia, unspecified: Secondary | ICD-10-CM | POA: Diagnosis not present

## 2020-09-15 DIAGNOSIS — I10 Essential (primary) hypertension: Secondary | ICD-10-CM | POA: Diagnosis not present

## 2020-09-15 DIAGNOSIS — Z8673 Personal history of transient ischemic attack (TIA), and cerebral infarction without residual deficits: Secondary | ICD-10-CM | POA: Diagnosis not present
